# Patient Record
Sex: Female | Born: 1970 | Race: White | Hispanic: No | Marital: Married | Smoking: Never smoker
Health system: Southern US, Community
[De-identification: ages and names within clinical notes are randomized; demographics above are authoritative.]

## PROBLEM LIST (undated history)

## (undated) DIAGNOSIS — J45909 Unspecified asthma, uncomplicated: Secondary | ICD-10-CM

## (undated) DIAGNOSIS — T7840XA Allergy, unspecified, initial encounter: Secondary | ICD-10-CM

## (undated) HISTORY — DX: Unspecified asthma, uncomplicated: J45.909

## (undated) HISTORY — DX: Allergy, unspecified, initial encounter: T78.40XA

## (undated) HISTORY — PX: TUBAL LIGATION: SHX77

---

## 1999-12-27 ENCOUNTER — Ambulatory Visit (HOSPITAL_COMMUNITY): Admission: RE | Admit: 1999-12-27 | Discharge: 1999-12-27 | Payer: Self-pay | Admitting: Obstetrics and Gynecology

## 2000-02-03 ENCOUNTER — Inpatient Hospital Stay (HOSPITAL_COMMUNITY): Admission: AD | Admit: 2000-02-03 | Discharge: 2000-02-03 | Payer: Self-pay | Admitting: Obstetrics & Gynecology

## 2000-02-08 ENCOUNTER — Inpatient Hospital Stay (HOSPITAL_COMMUNITY): Admission: AD | Admit: 2000-02-08 | Discharge: 2000-02-08 | Payer: Self-pay | Admitting: Obstetrics and Gynecology

## 2000-03-12 ENCOUNTER — Inpatient Hospital Stay (HOSPITAL_COMMUNITY): Admission: AD | Admit: 2000-03-12 | Discharge: 2000-03-14 | Payer: Self-pay | Admitting: Obstetrics and Gynecology

## 2000-04-21 ENCOUNTER — Other Ambulatory Visit: Admission: RE | Admit: 2000-04-21 | Discharge: 2000-04-21 | Payer: Self-pay | Admitting: Obstetrics and Gynecology

## 2002-06-24 ENCOUNTER — Ambulatory Visit (HOSPITAL_BASED_OUTPATIENT_CLINIC_OR_DEPARTMENT_OTHER): Admission: RE | Admit: 2002-06-24 | Discharge: 2002-06-24 | Payer: Self-pay | Admitting: Obstetrics and Gynecology

## 2013-08-24 ENCOUNTER — Encounter: Payer: Self-pay | Admitting: General Practice

## 2013-08-24 ENCOUNTER — Ambulatory Visit (INDEPENDENT_AMBULATORY_CARE_PROVIDER_SITE_OTHER): Payer: Managed Care, Other (non HMO) | Admitting: General Practice

## 2013-08-24 VITALS — BP 100/72 | Temp 98.1°F | Ht 63.75 in | Wt 173.0 lb

## 2013-08-24 DIAGNOSIS — Z8349 Family history of other endocrine, nutritional and metabolic diseases: Secondary | ICD-10-CM

## 2013-08-24 DIAGNOSIS — Z0000 Encounter for general adult medical examination without abnormal findings: Secondary | ICD-10-CM

## 2013-08-24 DIAGNOSIS — Z83438 Family history of other disorder of lipoprotein metabolism and other lipidemia: Secondary | ICD-10-CM

## 2013-08-24 LAB — POCT CBC
Granulocyte percent: 69.3 %G (ref 37–80)
HCT, POC: 40 % (ref 37.7–47.9)
Hemoglobin: 13.5 g/dL (ref 12.2–16.2)
Lymph, poc: 2 (ref 0.6–3.4)
MCHC: 33.8 g/dL (ref 31.8–35.4)
MCV: 89.9 fL (ref 80–97)
POC Granulocyte: 4.9 (ref 2–6.9)
RDW, POC: 13.8 %
WBC: 7.1 10*3/uL (ref 4.6–10.2)

## 2013-08-24 NOTE — Progress Notes (Signed)
  Subjective:    Patient ID: Teresa Woodard, female    DOB: 1971/01/28, 42 y.o.   MRN: [id number]  HPI Patient presents today for annual physical. She reports having asthma and taking symbicort as directed. She denies having concerns at this time. Reports last menstrual cycle as last week and it was regular cycle.     Review of Systems  Constitutional: Negative for fever and chills.  HENT: Negative for ear pain, neck pain and neck stiffness.   Respiratory: Negative for chest tightness and shortness of breath.   Gastrointestinal: Negative for nausea, vomiting, abdominal pain and blood in stool.  Genitourinary: Negative for hematuria.  Musculoskeletal: Negative for gait problem.  Neurological: Negative for dizziness, weakness and headaches.       Objective:   Physical Exam  Constitutional: She is oriented to person, place, and time. She appears well-developed and well-nourished.  HENT:  Head: Normocephalic and atraumatic.  Right Ear: External ear normal.  Left Ear: External ear normal.  Nose: Nose normal.  Mouth/Throat: Oropharynx is clear and moist.  Eyes: EOM are normal. Pupils are equal, round, and reactive to light.  Neck: Normal range of motion. Neck supple. No thyromegaly present.  Cardiovascular: Normal rate, regular rhythm and normal heart sounds.   Pulmonary/Chest: Effort normal and breath sounds normal. No respiratory distress. She exhibits no tenderness.  Abdominal: Soft. Bowel sounds are normal. She exhibits no distension. There is no tenderness.  Musculoskeletal: She exhibits no edema and no tenderness.  Lymphadenopathy:    She has no cervical adenopathy.  Neurological: She is alert and oriented to person, place, and time.  Skin: Skin is warm and dry.  Psychiatric: She has a normal mood and affect.          Assessment & Plan:  1. Annual physical exam - POCT CBC - CMP14+EGFR  2. Family history of hyperlipidemia - Lipid panel -follow up in one year for  annual physical and sooner if needed -Patient verbalized understanding -Coralie Keens, FNP-C

## 2013-08-25 LAB — LIPID PANEL
Chol/HDL Ratio: 4.1 ratio units (ref 0.0–4.4)
LDL Calculated: 109 mg/dL — ABNORMAL HIGH (ref 0–99)
Triglycerides: 115 mg/dL (ref 0–149)

## 2013-08-25 LAB — CMP14+EGFR
AST: 27 IU/L (ref 0–40)
Albumin: 4.2 g/dL (ref 3.5–5.5)
BUN: 13 mg/dL (ref 6–24)
CO2: 25 mmol/L (ref 18–29)
Calcium: 9.6 mg/dL (ref 8.7–10.2)
Creatinine, Ser: 0.82 mg/dL (ref 0.57–1.00)
Globulin, Total: 2.6 g/dL (ref 1.5–4.5)
Potassium: 4.6 mmol/L (ref 3.5–5.2)
Sodium: 142 mmol/L (ref 134–144)

## 2013-08-31 ENCOUNTER — Telehealth: Payer: Self-pay | Admitting: General Practice

## 2013-08-31 NOTE — Telephone Encounter (Signed)
Aware of results. 

## 2015-06-12 ENCOUNTER — Encounter: Payer: Self-pay | Admitting: Family Medicine

## 2016-06-24 ENCOUNTER — Ambulatory Visit (INDEPENDENT_AMBULATORY_CARE_PROVIDER_SITE_OTHER): Payer: Managed Care, Other (non HMO) | Admitting: Family Medicine

## 2016-06-24 ENCOUNTER — Encounter: Payer: Self-pay | Admitting: Family Medicine

## 2016-06-24 VITALS — BP 114/73 | HR 82 | Temp 98.1°F

## 2016-06-24 DIAGNOSIS — R591 Generalized enlarged lymph nodes: Secondary | ICD-10-CM

## 2016-06-24 DIAGNOSIS — R599 Enlarged lymph nodes, unspecified: Secondary | ICD-10-CM

## 2016-06-24 NOTE — Progress Notes (Signed)
BP 114/73 (BP Location: Left Arm, Patient Position: Sitting, Cuff Size: Normal)   Pulse 82   Temp 98.1 F (36.7 C) (Oral)   LMP 06/13/2016 (Approximate)    Subjective:    Patient ID: Teresa Woodard, female    DOB: 1971-08-23, 45 y.o.   MRN: [id number]  HPI: Teresa Woodard is a 45 y.o. female presenting on 06/24/2016 for Knot on neck   HPI Lump on neck Lump on her left side of her neck. She has noticed over the past 3 days that she has started to have this small somewhat tender lump on the left side of her neck. She has never had it before. She does not know of any recent colds or anything that she has had that she remembers but she may have had one that just passed bile stone. She denies any fevers or chills or overlying skin changes. She denies any sore throat or shortness of breath.  Relevant past medical, surgical, family and social history reviewed and updated as indicated. Interim medical history since our last visit reviewed. Allergies and medications reviewed and updated.  Review of Systems  Constitutional: Negative for chills and fever.  HENT: Negative for congestion, ear discharge, ear pain, postnasal drip, sinus pressure and sore throat.   Eyes: Negative for redness and visual disturbance.  Respiratory: Negative for chest tightness and shortness of breath.   Cardiovascular: Negative for chest pain and leg swelling.  Genitourinary: Negative for difficulty urinating and dysuria.  Musculoskeletal: Negative for back pain and gait problem.  Skin: Negative for color change and rash.  Neurological: Negative for light-headedness and headaches.  Psychiatric/Behavioral: Negative for agitation and behavioral problems.  All other systems reviewed and are negative.   Per HPI unless specifically indicated above     Medication List       Accurate as of 06/24/16 12:59 PM. Always use your most recent med list.          cetirizine 10 MG tablet Commonly known as:   ZYRTEC Take 10 mg by mouth daily.   cholecalciferol 1000 units tablet Commonly known as:  VITAMIN D Take 1,000 Units by mouth daily.   fluticasone 50 MCG/ACT nasal spray Commonly known as:  FLONASE Place 2 sprays into both nostrils daily.   SYMBICORT 160-4.5 MCG/ACT inhaler Generic drug:  budesonide-formoterol          Objective:    BP 114/73 (BP Location: Left Arm, Patient Position: Sitting, Cuff Size: Normal)   Pulse 82   Temp 98.1 F (36.7 C) (Oral)   LMP 06/13/2016 (Approximate)   Wt Readings from Last 3 Encounters:  08/24/13 173 lb (78.5 kg)    Physical Exam  Constitutional: She is oriented to person, place, and time. She appears well-developed and well-nourished. No distress.  Eyes: Conjunctivae and EOM are normal. Pupils are equal, round, and reactive to light.  Cardiovascular: Normal rate, regular rhythm, normal heart sounds and intact distal pulses.   No murmur heard. Pulmonary/Chest: Effort normal and breath sounds normal. No respiratory distress. She has no wheezes.  Musculoskeletal: Normal range of motion. She exhibits no edema or tenderness.  Lymphadenopathy:    She has cervical adenopathy (Left anterior cervical lymph node that is tender and mobile).  Neurological: She is alert and oriented to person, place, and time. Coordination normal.  Skin: Skin is warm and dry. No rash noted. She is not diaphoretic.  Psychiatric: She has a normal mood and affect. Her behavior is normal.  Nursing  note and vitals reviewed.     Assessment & Plan:   Problem List Items Addressed This Visit    None    Visit Diagnoses    Lymphadenopathy of head and neck    -  Primary   Enlarged lymph node on left anterior cervical. We'll monitor for now, it's only been there 3 days       Follow up plan: Return in about 4 weeks (around 07/22/2016), or if symptoms worsen or fail to improve, for Well woman exam and fasting labs.  Counseling provided for all of the vaccine  components No orders of the defined types were placed in this encounter.   Arville Care, MD [facility] 06/24/2016, 12:59 PM

## 2016-07-25 ENCOUNTER — Ambulatory Visit (INDEPENDENT_AMBULATORY_CARE_PROVIDER_SITE_OTHER): Payer: Managed Care, Other (non HMO) | Admitting: Family Medicine

## 2016-07-25 ENCOUNTER — Encounter: Payer: Self-pay | Admitting: Family Medicine

## 2016-07-25 VITALS — BP 113/73 | HR 77 | Temp 98.0°F | Ht 63.75 in | Wt 177.2 lb

## 2016-07-25 DIAGNOSIS — Z0000 Encounter for general adult medical examination without abnormal findings: Secondary | ICD-10-CM | POA: Diagnosis not present

## 2016-07-25 DIAGNOSIS — Z114 Encounter for screening for human immunodeficiency virus [HIV]: Secondary | ICD-10-CM

## 2016-07-25 DIAGNOSIS — B351 Tinea unguium: Secondary | ICD-10-CM

## 2016-07-25 DIAGNOSIS — R590 Localized enlarged lymph nodes: Secondary | ICD-10-CM

## 2016-07-25 MED ORDER — TERBINAFINE HCL 250 MG PO TABS: 250.0000 mg | ORAL_TABLET | Freq: Every day | ORAL | 2 refills | Status: DC

## 2016-07-25 NOTE — Progress Notes (Signed)
BP 113/73   Pulse 77   Temp 98 F (36.7 C) (Oral)   Ht 5' 3.75" (1.619 m)   Wt 177 lb 3.2 oz (80.4 kg)   LMP 07/11/2016 (Approximate)   BMI 30.66 kg/m    Subjective:    Patient ID: Teresa Woodard, female    DOB: 08-Nov-1971, 45 y.o.   MRN: [id number]  HPI: Teresa Woodard is a 45 y.o. female presenting on 07/25/2016 for Annual Exam (needs paperwork filled out for work; needs lipid panel, blood sugar and BMI - patient is fasting)   HPI Well adult exam Patient is coming today for a well adult exam without Pap because she gets her Pap smear down in [place]. She denies any chest pain, shortness of breath, headaches or vision issues, abdominal complaints, diarrhea, nausea, vomiting, or joint issues.   Toenail fungus Patient has had toenail fungus on her 2 big toes for quite some time and is starting to get it on her fourth and fifth toenails on her right foot as well. She tried an oral medication for a few weeks quite some time ago but did not notice that it helped. She has been dealing with this for many years and wants no further something that can be done to treat it. She denies any pain associated with it or redness or warmth.   Lump on neck Patient still feels like she has a lump on the left side of her neck which we thought initially was an anterior lymph node only saw her a few months ago but she still feels like it's there and has not changed. It is nontender and mobile and small and no overlying skin changes. Does not cause any issues with her swallowing.  Relevant past medical, surgical, family and social history reviewed and updated as indicated. Interim medical history since our last visit reviewed. Allergies and medications reviewed and updated.  Review of Systems  Constitutional: Negative for chills and fever.  HENT: Negative for congestion, ear discharge, ear pain and tinnitus.   Eyes: Negative for pain, redness and visual disturbance.  Respiratory: Negative for  cough, chest tightness, shortness of breath and wheezing.   Cardiovascular: Negative for chest pain, palpitations and leg swelling.  Gastrointestinal: Negative for abdominal pain, blood in stool, constipation and diarrhea.  Genitourinary: Negative for difficulty urinating, dysuria and hematuria.  Musculoskeletal: Negative for back pain, gait problem and myalgias.  Skin: Positive for color change (toenail fungus). Negative for rash.  Neurological: Negative for dizziness, weakness, light-headedness and headaches.  Psychiatric/Behavioral: Negative for agitation, behavioral problems and suicidal ideas.  All other systems reviewed and are negative.   Per HPI unless specifically indicated above     Medication List       Accurate as of 07/25/16 11:11 AM. Always use your most recent med list.          cetirizine 10 MG tablet Commonly known as:  ZYRTEC Take 10 mg by mouth daily.   cholecalciferol 1000 units tablet Commonly known as:  VITAMIN D Take 1,000 Units by mouth daily.   fluticasone 50 MCG/ACT nasal spray Commonly known as:  FLONASE Place 2 sprays into both nostrils daily.   SYMBICORT 160-4.5 MCG/ACT inhaler Generic drug:  budesonide-formoterol   terbinafine 250 MG tablet Commonly known as:  LAMISIL Take 1 tablet (250 mg total) by mouth daily.          Objective:    BP 113/73   Pulse 77   Temp 98 F (  36.7 C) (Oral)   Ht 5' 3.75" (1.619 m)   Wt 177 lb 3.2 oz (80.4 kg)   LMP 07/11/2016 (Approximate)   BMI 30.66 kg/m   Wt Readings from Last 3 Encounters:  07/25/16 177 lb 3.2 oz (80.4 kg)  08/24/13 173 lb (78.5 kg)    Physical Exam  Constitutional: She is oriented to person, place, and time. She appears well-developed and well-nourished. No distress.  HENT:  Right Ear: External ear normal.  Left Ear: External ear normal.  Nose: Nose normal.  Mouth/Throat: Oropharynx is clear and moist. No oropharyngeal exudate.  Eyes: Conjunctivae and EOM are normal.  Pupils are equal, round, and reactive to light.  Neck: Neck supple.  Cardiovascular: Normal rate, regular rhythm, normal heart sounds and intact distal pulses.   No murmur heard. Pulmonary/Chest: Effort normal and breath sounds normal. No respiratory distress. She has no wheezes.  Abdominal: Soft. Bowel sounds are normal. She exhibits no distension. There is no tenderness. There is no rebound and no guarding.  Musculoskeletal: Normal range of motion. She exhibits no edema or tenderness.  Lymphadenopathy:    She has cervical adenopathy (Patient has a mobile nontender left anterior cervical lymph node versus thyromegaly on that side., Based on exam feels more like a lymph node.).  Neurological: She is alert and oriented to person, place, and time. Coordination normal.  Skin: Skin is warm and dry. No rash noted. She is not diaphoretic.  Patient has thickened and yellowed discolored nails on both of her great toes and her right fourth and fifth toenails.  Psychiatric: She has a normal mood and affect. Her behavior is normal. Thought content normal.  Nursing note and vitals reviewed.   Results for orders placed or performed in visit on 08/24/13  Lipid panel  Result Value Ref Range   Cholesterol, Total 174 100 - 199 mg/dL   Triglycerides 115 0 - 149 mg/dL   HDL 42 >39 mg/dL   VLDL Cholesterol Cal 23 5 - 40 mg/dL   LDL Calculated 109 (H) 0 - 99 mg/dL   Chol/HDL Ratio 4.1 0.0 - 4.4 ratio units  CMP14+EGFR  Result Value Ref Range   Glucose 81 65 - 99 mg/dL   BUN 13 6 - 24 mg/dL   Creatinine, Ser 0.82 0.57 - 1.00 mg/dL   GFR calc non Af Amer 88 >59 mL/min/1.73   GFR calc Af Amer 101 >59 mL/min/1.73   BUN/Creatinine Ratio 16 9 - 23   Sodium 142 134 - 144 mmol/L   Potassium 4.6 3.5 - 5.2 mmol/L   Chloride 101 97 - 108 mmol/L   CO2 25 18 - 29 mmol/L   Calcium 9.6 8.7 - 10.2 mg/dL   Total Protein 6.8 6.0 - 8.5 g/dL   Albumin 4.2 3.5 - 5.5 g/dL   Globulin, Total 2.6 1.5 - 4.5 g/dL    Albumin/Globulin Ratio 1.6 1.1 - 2.5   Total Bilirubin 0.5 0.0 - 1.2 mg/dL   Alkaline Phosphatase 79 39 - 117 IU/L   AST 27 0 - 40 IU/L   ALT 44 (H) 0 - 32 IU/L  POCT CBC  Result Value Ref Range   WBC 7.1 4.6 - 10.2 K/uL   Lymph, poc 2.0 0.6 - 3.4   POC LYMPH PERCENT 28.6 10 - 50 %L   POC Granulocyte 4.9 2 - 6.9   Granulocyte percent 69.3 37 - 80 %G   RBC 4.5 4.04 - 5.48 M/uL   Hemoglobin 13.5 12.2 - 16.2 g/dL  HCT, POC 40.0 37.7 - 47.9 %   MCV 89.9 80 - 97 fL   MCH, POC 30.4 27 - 31.2 pg   MCHC 33.8 31.8 - 35.4 g/dL   RDW, POC 13.8 %   Platelet Count, POC 254.0 142 - 424 K/uL   MPV 7.2 0 - 99.8 fL      Assessment & Plan:   Problem List Items Addressed This Visit    None    Visit Diagnoses    Well adult exam    -  Primary   Patient gets Pap smears elsewhere   Relevant Orders   CMP14+EGFR   Lipid panel   Anterior cervical lymphadenopathy       Concern for anterior cervical lymphadenopathy versus thyroid enlargement, will do ultrasound and test thyroid levels   Relevant Orders   TSH   US Soft Tissue Head/Neck   Onychomycosis       Left big toe and right big toe and fourth and fifth toe, will do 12 week course   Relevant Medications   terbinafine (LAMISIL) 250 MG tablet   Screening for HIV without presence of risk factors       Relevant Orders   HIV antibody       Follow up plan: Return in about 1 year (around 07/25/2017), or if symptoms worsen or fail to improve.  Counseling provided for all of the vaccine components Orders Placed This Encounter  Procedures  . US Soft Tissue Head/Neck  . CMP14+EGFR  . Lipid panel  . TSH  . HIV antibody    Caryl Pina, MD [facility] 07/25/2016, 11:11 AM

## 2016-07-26 LAB — LIPID PANEL
Chol/HDL Ratio: 3.9 ratio units (ref 0.0–4.4)
Cholesterol, Total: 169 mg/dL (ref 100–199)
HDL: 43 mg/dL (ref >39)
LDL Calculated: 100 mg/dL — ABNORMAL HIGH (ref 0–99)
Triglycerides: 129 mg/dL (ref 0–149)
VLDL Cholesterol Cal: 26 mg/dL (ref 5–40)

## 2016-07-26 LAB — CMP14+EGFR
A/G RATIO: 1.6 (ref 1.2–2.2)
ALK PHOS: 82 IU/L (ref 39–117)
ALT: 38 IU/L — AB (ref 0–32)
AST: 25 IU/L (ref 0–40)
Albumin: 4.2 g/dL (ref 3.5–5.5)
BILIRUBIN TOTAL: 0.6 mg/dL (ref 0.0–1.2)
BUN/Creatinine Ratio: 14 (ref 9–23)
BUN: 12 mg/dL (ref 6–24)
CO2: 20 mmol/L (ref 18–29)
CREATININE: 0.87 mg/dL (ref 0.57–1.00)
Calcium: 9.2 mg/dL (ref 8.7–10.2)
Chloride: 102 mmol/L (ref 96–106)
GFR calc Af Amer: 92 mL/min/{1.73_m2} (ref >59)
GFR, EST NON AFRICAN AMERICAN: 80 mL/min/{1.73_m2} (ref >59)
Globulin, Total: 2.6 g/dL (ref 1.5–4.5)
Glucose: 86 mg/dL (ref 65–99)
Potassium: 4 mmol/L (ref 3.5–5.2)
Sodium: 141 mmol/L (ref 134–144)
Total Protein: 6.8 g/dL (ref 6.0–8.5)

## 2016-07-26 LAB — TSH: TSH: 1.16 u[IU]/mL (ref 0.450–4.500)

## 2016-07-26 LAB — HIV ANTIBODY (ROUTINE TESTING W REFLEX): HIV Screen 4th Generation wRfx: NONREACTIVE

## 2016-07-29 ENCOUNTER — Ambulatory Visit (HOSPITAL_COMMUNITY)
Admission: RE | Admit: 2016-07-29 | Discharge: 2016-07-29 | Disposition: A | Discharge status: Home / Self Care | Payer: Managed Care, Other (non HMO) | Source: Ambulatory Visit | Attending: Family Medicine | Admitting: Family Medicine

## 2016-07-29 DIAGNOSIS — R599 Enlarged lymph nodes, unspecified: Secondary | ICD-10-CM | POA: Insufficient documentation

## 2016-07-29 DIAGNOSIS — R590 Localized enlarged lymph nodes: Secondary | ICD-10-CM

## 2016-09-25 ENCOUNTER — Other Ambulatory Visit: Payer: Self-pay | Admitting: Obstetrics and Gynecology

## 2016-09-25 DIAGNOSIS — Z803 Family history of malignant neoplasm of breast: Secondary | ICD-10-CM

## 2016-10-20 ENCOUNTER — Other Ambulatory Visit: Payer: Self-pay | Admitting: Family Medicine

## 2016-10-20 DIAGNOSIS — B351 Tinea unguium: Secondary | ICD-10-CM

## 2016-10-23 ENCOUNTER — Ambulatory Visit
Admission: RE | Admit: 2016-10-23 | Discharge: 2016-10-23 | Disposition: A | Discharge status: Home / Self Care | Payer: Managed Care, Other (non HMO) | Source: Ambulatory Visit | Attending: Obstetrics and Gynecology | Admitting: Obstetrics and Gynecology

## 2016-10-23 DIAGNOSIS — Z803 Family history of malignant neoplasm of breast: Secondary | ICD-10-CM

## 2016-10-23 MED ORDER — GADOBENATE DIMEGLUMINE 529 MG/ML IV SOLN
15.0000 mL | Freq: Once | INTRAVENOUS | Status: AC | PRN
  Administered 2016-10-23: 15 mL via INTRAVENOUS

## 2017-01-17 ENCOUNTER — Other Ambulatory Visit: Payer: Self-pay | Admitting: Family Medicine

## 2017-01-17 DIAGNOSIS — B351 Tinea unguium: Secondary | ICD-10-CM

## 2017-02-17 ENCOUNTER — Other Ambulatory Visit: Payer: Self-pay | Admitting: Family Medicine

## 2017-02-17 DIAGNOSIS — B351 Tinea unguium: Secondary | ICD-10-CM

## 2017-10-31 ENCOUNTER — Other Ambulatory Visit: Payer: Self-pay | Admitting: Obstetrics and Gynecology

## 2017-10-31 DIAGNOSIS — Z803 Family history of malignant neoplasm of breast: Secondary | ICD-10-CM

## 2017-11-21 ENCOUNTER — Ambulatory Visit
Admission: RE | Admit: 2017-11-21 | Discharge: 2017-11-21 | Disposition: A | Discharge status: Home / Self Care | Payer: 59 | Source: Ambulatory Visit | Attending: Obstetrics and Gynecology | Admitting: Obstetrics and Gynecology

## 2017-11-21 DIAGNOSIS — Z803 Family history of malignant neoplasm of breast: Secondary | ICD-10-CM

## 2017-11-21 MED ORDER — GADOBENATE DIMEGLUMINE 529 MG/ML IV SOLN
17.0000 mL | Freq: Once | INTRAVENOUS | Status: AC | PRN
  Administered 2017-11-21: 17 mL via INTRAVENOUS

## 2018-06-26 ENCOUNTER — Encounter: Payer: Self-pay | Admitting: Pediatrics

## 2018-06-26 ENCOUNTER — Ambulatory Visit (INDEPENDENT_AMBULATORY_CARE_PROVIDER_SITE_OTHER): Payer: Managed Care, Other (non HMO) | Admitting: Pediatrics

## 2018-06-26 VITALS — BP 119/78 | HR 86 | Temp 97.6°F | Ht 63.75 in | Wt 176.8 lb

## 2018-06-26 DIAGNOSIS — L237 Allergic contact dermatitis due to plants, except food: Secondary | ICD-10-CM | POA: Diagnosis not present

## 2018-06-26 MED ORDER — TRIAMCINOLONE ACETONIDE 0.025 % EX OINT: 1.0000 "application " | TOPICAL_OINTMENT | Freq: Two times a day (BID) | CUTANEOUS | 0 refills | Status: DC

## 2018-06-26 NOTE — Progress Notes (Addendum)
  Subjective:   Patient ID: Teresa Woodard, female    DOB: 10/13/1971, 47 y.o.   MRN: [id number] CC: Rash (left side of face, scalp and chest, )  HPI: Teresa Geroldricia M Thaxton is a 47 y.o. female   Place left side of her nose bothering her the most.  Red papule there.  Itchy.   Son recently got poison ivy, she thinks she might also have some.  She has been using topical Benadryl and oral Benadryl, helps some with the itching, still bothering her.  Relevant past medical, surgical, family and social history reviewed. Allergies and medications reviewed and updated. Social History   Tobacco Use  Smoking Status Never Smoker  Smokeless Tobacco Never Used   ROS: Per HPI   Objective:    BP 119/78   Pulse 86   Temp 97.6 F (36.4 C) (Oral)   Ht 5' 3.75" (1.619 m)   Wt 176 lb 12.8 oz (80.2 kg)   BMI 30.59 kg/m   Wt Readings from Last 3 Encounters:  06/26/18 176 lb 12.8 oz (80.2 kg)  07/25/16 177 lb 3.2 oz (80.4 kg)  08/24/13 173 lb (78.5 kg)    Gen: NAD, alert, cooperative with exam, NCAT EYES: EOMI, no conjunctival injection, or no icterus CV: NRRR, normal S1/S2, no murmur, distal pulses 2+ b/l Resp: CTABL, no wheezes, normal WOB Neuro: Alert and oriented, strength equal b/l UE and LE, coordination grossly normal MSK: normal muscle bulk Skin: Slightly red <1cm patch with a 1mm red papule on her cheek to the left of her nose.    Assessment & Plan:  Nicki Guadalajararicia was seen today for rash.  Diagnoses and all orders for this visit:  Poison ivy Use below next couple days.  Use sunscreen, sun protection on parts of skin where it is used.  If rash/itching do not improve, and for any worsening let me know. -     triamcinolone (KENALOG) 0.025 % ointment; Apply 1 application topically 2 (two) times daily.   Follow up plan: As needed Rex Krasarol Vincent, MD [facility]

## 2018-10-15 ENCOUNTER — Encounter: Payer: Self-pay | Admitting: Pediatrics

## 2018-10-15 ENCOUNTER — Ambulatory Visit: Payer: 59 | Admitting: Pediatrics

## 2018-10-15 VITALS — BP 120/79 | HR 104 | Temp 97.5°F | Resp 22 | Ht 63.75 in | Wt 177.6 lb

## 2018-10-15 DIAGNOSIS — J069 Acute upper respiratory infection, unspecified: Secondary | ICD-10-CM

## 2018-10-15 NOTE — Patient Instructions (Signed)

## 2018-10-15 NOTE — Progress Notes (Signed)
  Subjective:   Patient ID: Teresa Woodard, female    DOB: 11/29/1971, 47 y.o.   MRN: [id number] CC: Cough; Chest congestion; and Chills  HPI: Teresa Woodard is a 47 y.o. female   Symptoms started 3 days ago. Chills at home. Coughing some, mostly dry.  Some nasal congestion.  Ears feel itchy.  Mother recently put on hospice, family members taking turns watching her.  Appetite has been slightly down for the last day.  Taking Flonase, Zyrtec, NyQuil.  Relevant past medical, surgical, family and social history reviewed. Allergies and medications reviewed and updated. Social History   Tobacco Use  Smoking Status Never Smoker  Smokeless Tobacco Never Used   ROS: Per HPI   Objective:    BP 120/79   Pulse (!) 104   Temp (!) 97.5 F (36.4 C) (Oral)   Resp (!) 22   Ht 5' 3.75" (1.619 m)   Wt 177 lb 9.6 oz (80.6 kg)   SpO2 96%   BMI 30.72 kg/m   Wt Readings from Last 3 Encounters:  10/15/18 177 lb 9.6 oz (80.6 kg)  06/26/18 176 lb 12.8 oz (80.2 kg)  07/25/16 177 lb 3.2 oz (80.4 kg)    Gen: NAD, alert, cooperative with exam, NCAT, congested EYES: EOMI, no conjunctival injection, or no icterus ENT:  TMs pearly pink with small layering white effusion b/l, OP without erythema LYMPH: no cervical LAD CV: NRRR, normal S1/S2, no murmur, distal pulses 2+ b/l Resp: CTABL, no wheezes, normal WOB Abd: +BS, soft, NTND. no guarding or organomegaly Ext: No edema, warm Neuro: Alert and oriented, strength equal b/l UE and LE, coordination grossly normal MSK: normal muscle bulk  Assessment & Plan:  Teresa Woodard was seen today for cough, chest congestion and chills.  Diagnoses and all orders for this visit:  Acute URI Symptoms ongoing for 3 days.  Discussed symptom care, sinus rinses, return precautions.  Follow up plan: Return if symptoms worsen or fail to improve. Rex Krasarol Lucius Wise, MD [facility]

## 2018-11-20 ENCOUNTER — Other Ambulatory Visit: Payer: Self-pay | Admitting: Obstetrics and Gynecology

## 2018-11-20 DIAGNOSIS — Z803 Family history of malignant neoplasm of breast: Secondary | ICD-10-CM

## 2019-08-26 ENCOUNTER — Other Ambulatory Visit: Payer: Self-pay

## 2019-08-27 ENCOUNTER — Ambulatory Visit (INDEPENDENT_AMBULATORY_CARE_PROVIDER_SITE_OTHER): Payer: 59 | Admitting: Family Medicine

## 2019-08-27 ENCOUNTER — Encounter: Payer: Self-pay | Admitting: Family Medicine

## 2019-08-27 VITALS — BP 118/77 | HR 83 | Temp 97.5°F | Resp 16 | Ht 63.75 in | Wt 183.2 lb

## 2019-08-27 DIAGNOSIS — Z0001 Encounter for general adult medical examination with abnormal findings: Secondary | ICD-10-CM | POA: Diagnosis not present

## 2019-08-27 DIAGNOSIS — K219 Gastro-esophageal reflux disease without esophagitis: Secondary | ICD-10-CM

## 2019-08-27 DIAGNOSIS — Z803 Family history of malignant neoplasm of breast: Secondary | ICD-10-CM | POA: Diagnosis not present

## 2019-08-27 DIAGNOSIS — Z6831 Body mass index (BMI) 31.0-31.9, adult: Secondary | ICD-10-CM

## 2019-08-27 DIAGNOSIS — F411 Generalized anxiety disorder: Secondary | ICD-10-CM

## 2019-08-27 DIAGNOSIS — Z13220 Encounter for screening for lipoid disorders: Secondary | ICD-10-CM

## 2019-08-27 DIAGNOSIS — J4530 Mild persistent asthma, uncomplicated: Secondary | ICD-10-CM

## 2019-08-27 DIAGNOSIS — Z23 Encounter for immunization: Secondary | ICD-10-CM

## 2019-08-27 DIAGNOSIS — E559 Vitamin D deficiency, unspecified: Secondary | ICD-10-CM | POA: Insufficient documentation

## 2019-08-27 DIAGNOSIS — L639 Alopecia areata, unspecified: Secondary | ICD-10-CM | POA: Insufficient documentation

## 2019-08-27 DIAGNOSIS — J45909 Unspecified asthma, uncomplicated: Secondary | ICD-10-CM | POA: Insufficient documentation

## 2019-08-27 DIAGNOSIS — Z130 Encounter for screening for diseases of the blood and blood-forming organs and certain disorders involving the immune mechanism: Secondary | ICD-10-CM

## 2019-08-27 DIAGNOSIS — Z0000 Encounter for general adult medical examination without abnormal findings: Secondary | ICD-10-CM

## 2019-08-27 DIAGNOSIS — Z1329 Encounter for screening for other suspected endocrine disorder: Secondary | ICD-10-CM

## 2019-08-27 DIAGNOSIS — E669 Obesity, unspecified: Secondary | ICD-10-CM | POA: Insufficient documentation

## 2019-08-27 MED ORDER — HYDROXYZINE PAMOATE 25 MG PO CAPS: 25.0000 mg | ORAL_CAPSULE | Freq: Three times a day (TID) | ORAL | 1 refills | Status: DC | PRN

## 2019-08-27 MED ORDER — FLUOXETINE HCL 20 MG PO CAPS: 20.0000 mg | ORAL_CAPSULE | Freq: Every day | ORAL | 3 refills | Status: DC

## 2019-08-27 MED ORDER — ESOMEPRAZOLE MAGNESIUM 20 MG PO CPDR: 20.0000 mg | DELAYED_RELEASE_CAPSULE | Freq: Every day | ORAL | 1 refills | Status: DC

## 2019-08-27 NOTE — Patient Instructions (Signed)
Alopecia Areata, Adult  Alopecia areata is a condition that causes you to lose hair. You may lose hair on your scalp in patches. In some cases, you may lose all the hair on your scalp (alopecia totalis) or all the hair from your face and body (alopecia universalis). Alopecia areata is an autoimmune disease. This means that your body's defense system (immune system) mistakes normal parts of the body for germs or other things that can make you sick. When you have alopecia areata, the immune system attacks the hair follicles. Alopecia areata usually develops in childhood, but it can develop at any age. For some people, their hair grows back on its own and hair loss does not happen again. For others, their hair may fall out and grow back in cycles. The hair loss may last many years. Having this condition can be emotionally difficult, but it is not dangerous. What are the causes? The cause of this condition is not known. What increases the risk? This condition is more likely to develop in people who have:  A family history of alopecia.  A family history of another autoimmune disease, including type 1 diabetes and rheumatoid arthritis.  Asthma and allergies.  Down syndrome. What are the signs or symptoms? Round spots of patchy hair loss on the scalp is the main symptom of this condition. The spots may be mildly itchy. Other symptoms include:  Short dark hairs in the bald patches that are wider at the top (exclamation point hairs).  Dents, white spots, or lines in the fingernails or toenails.  Balding and body hair loss. This is rare. How is this diagnosed? This condition is diagnosed based on your symptoms and family history. Your health care provider will also check your scalp skin, teeth, and nails. Your health care provider may refer you to a specialist in hair and skin disorders (dermatologist). You may also have tests, including:  A hair pull test.  Blood tests or other screening tests  to check for autoimmune diseases, such as thyroid disease or diabetes.  Skin biopsy to confirm the diagnosis.  A procedure to examine the skin with a lighted magnifying instrument (dermoscopy). How is this treated? There is no cure for alopecia areata. Treatment is aimed at promoting the regrowth of hair and preventing the immune system from overreacting. No single treatment is right for all people with alopecia areata. It depends on the type of hair loss you have and how severe it is. Work with your health care provider to find the best treatment for you. Treatment may include:  Having regular checkups to make sure the condition is not getting worse (watchful waiting).  Steroid creams or pills for 6-8 weeks to stop the immune reaction and help hair to regrow more quickly.  Other topical medicines to alter the immune system response and support the hair growth cycle.  Steroid injections.  Therapy and counseling with a support group or therapist if you are having trouble coping with hair loss. Follow these instructions at home:  Learn as much as you can about your condition.  Apply topical creams only as told by your health care provider.  Take over-the-counter and prescription medicines only as told by your health care provider.  Consider getting a wig or products to make hair look fuller or to cover bald spots, if you feel uncomfortable with your appearance.  Get therapy or counseling if you are having a hard time coping with hair loss. Ask your health care provider to recommend   a counselor or support group.  Keep all follow-up visits as told by your health care provider. This is important. Contact a health care provider if:  Your hair loss gets worse, even with treatment.  You have new symptoms.  You are struggling emotionally. Summary  Alopecia areata is an autoimmune condition that makes your body's defense system (immune system) attack the hair follicles. This causes you  to lose hair.  Treatments may include regular checkups to make sure that the condition is not getting worse (watchful waiting), medicines, and steroid injections. This information is not intended to replace advice given to you by your health care provider. Make sure you discuss any questions you have with your health care provider. Document Released: 06/22/2004 Document Revised: 10/31/2017 Document Reviewed: 12/06/2016 Elsevier Patient Education  2020 [employer]  

## 2019-08-27 NOTE — Progress Notes (Signed)
Subjective:  Patient ID: Teresa Woodard, female    DOB: 05-05-1971, 48 y.o.   MRN: [id number]  Patient Care Team: Baruch Gouty, FNP as PCP - General (Family Medicine)   Chief Complaint:  Annual Exam   HPI: Teresa Woodard is a 48 y.o. female presenting on 08/27/2019 for Annual Exam   Pt presents today for her annual physical exam. She is followed by GYN and has her PAPs and mammograms completed there. States she is due for her mammogram. Her mother died from beast cancer and it was recommended the pt has MRI of breast every 6 months alternating with a mammogram every 6 months. Pt states she has been following this schedule.  She is on vit D repletion therapy and tolerating this well. She does have GERD, states this is well controlled with Nexium over the counter daily. States she notices a difference when she does not take the Nexium. No hemoptysis, voice changes, dysphagia, weight loss, diaphoresis, melena, or hematochezia.  Pt reports history of mild asthma. She is compliant with her medications and foes well with them. She is followed by pulmonology.  She has noticed an increase in anxiety over the last year. States her mothers illness might have triggered this. States she has had episodes of anxiety and general worry in the past but this time it has not subsided. She has developed bald spots in her head. States she went to dermatology and was told this was alopecia due to her anxiety and stress. Pt states the anxiety is affecting her sleep. States she has a very difficult time sleeping, reports it takes a long time to fall asleep and then she wakes several times during the night.   Depression screen [facility] 2/9 08/27/2019 10/15/2018 06/26/2018 07/25/2016 06/24/2016  Decreased Interest 0 0 0 0 0  Down, Depressed, Hopeless 0 0 0 0 0  PHQ - 2 Score 0 0 0 0 0   GAD 7 : Generalized Anxiety Score 08/27/2019  Nervous, Anxious, on Edge 2  Control/stop worrying 2  Worry too much - different  things 2  Trouble relaxing 2  Restless 0  Easily annoyed or irritable 1  Afraid - awful might happen 0  Total GAD 7 Score 9    Relevant past medical, surgical, family, and social history reviewed and updated as indicated.  Allergies and medications reviewed and updated. Date reviewed: Chart in Epic.   Past Medical History:  Diagnosis Date  . Asthma     Past Surgical History:  Procedure Laterality Date  . TUBAL LIGATION      Social History   Socioeconomic History  . Marital status: Married    Spouse name: Not on file  . Number of children: Not on file  . Years of education: Not on file  . Highest education level: Not on file  Occupational History  . Not on file  Social Needs  . Financial resource strain: Not on file  . Food insecurity    Worry: Not on file    Inability: Not on file  . Transportation needs    Medical: Not on file    Non-medical: Not on file  Tobacco Use  . Smoking status: Never Smoker  . Smokeless tobacco: Never Used  Substance and Sexual Activity  . Alcohol use: No  . Drug use: No  . Sexual activity: Not on file  Lifestyle  . Physical activity    Days per week: Not on file  Minutes per session: Not on file  . Stress: Not on file  Relationships  . Social Herbalist on phone: Not on file    Gets together: Not on file    Attends religious service: Not on file    Active member of club or organization: Not on file    Attends meetings of clubs or organizations: Not on file    Relationship status: Not on file  . Intimate partner violence    Fear of current or ex partner: Not on file    Emotionally abused: Not on file    Physically abused: Not on file    Forced sexual activity: Not on file  Other Topics Concern  . Not on file  Social History Narrative  . Not on file    Outpatient Encounter Medications as of 08/27/2019  Medication Sig  . albuterol (VENTOLIN HFA) 108 (90 Base) MCG/ACT inhaler Inhale into the lungs every 6 (six)  hours as needed.  . cetirizine (ZYRTEC) 10 MG tablet Take 10 mg by mouth daily.  . cholecalciferol (VITAMIN D) 1000 units tablet Take 1,000 Units by mouth daily.  . clobetasol (TEMOVATE) 0.05 % external solution Apply 1 application topically 2 (two) times daily.  Marland Kitchen esomeprazole (NEXIUM) 20 MG capsule Take 1 capsule (20 mg total) by mouth daily at 12 noon. OTC  . fluticasone (FLONASE) 50 MCG/ACT nasal spray Place 2 sprays into both nostrils daily.  . SYMBICORT 160-4.5 MCG/ACT inhaler   . [DISCONTINUED] esomeprazole (NEXIUM) 20 MG capsule Take 20 mg by mouth daily at 12 noon. OTC  . FLUoxetine (PROZAC) 20 MG capsule Take 1 capsule (20 mg total) by mouth at bedtime.  . hydrOXYzine (VISTARIL) 25 MG capsule Take 1 capsule (25 mg total) by mouth 3 (three) times daily as needed.  . [DISCONTINUED] DEXILANT 30 MG capsule Take 1 capsule by mouth daily.  . [DISCONTINUED] triamcinolone (KENALOG) 0.025 % ointment Apply 1 application topically 2 (two) times daily.   No facility-administered encounter medications on file as of 08/27/2019.     No Known Allergies  Review of Systems  Constitutional: Negative for activity change, appetite change, chills, diaphoresis, fatigue, fever and unexpected weight change.  HENT: Negative.  Negative for sore throat, tinnitus, trouble swallowing and voice change.   Eyes: Negative.  Negative for photophobia and visual disturbance.  Respiratory: Negative for cough, chest tightness and shortness of breath.   Cardiovascular: Negative for chest pain, palpitations and leg swelling.  Gastrointestinal: Negative for abdominal distention, abdominal pain, anal bleeding, blood in stool, constipation, diarrhea, nausea, rectal pain and vomiting.  Endocrine: Negative.  Negative for cold intolerance, heat intolerance, polydipsia, polyphagia and polyuria.  Genitourinary: Negative for decreased urine volume, difficulty urinating, dyspareunia, dysuria, enuresis, flank pain, frequency,  genital sores, hematuria, menstrual problem, pelvic pain, urgency, vaginal bleeding, vaginal discharge and vaginal pain.  Musculoskeletal: Negative for arthralgias, back pain, gait problem, joint swelling, myalgias, neck pain and neck stiffness.  Skin: Negative.   Allergic/Immunologic: Negative.   Neurological: Negative for dizziness, tremors, seizures, syncope, speech difficulty, weakness, light-headedness, numbness and headaches.  Hematological: Negative.  Negative for adenopathy. Does not bruise/bleed easily.  Psychiatric/Behavioral: Positive for decreased concentration, dysphoric mood and sleep disturbance. Negative for agitation, behavioral problems, confusion, hallucinations, self-injury and suicidal ideas. The patient is nervous/anxious. The patient is not hyperactive.   All other systems reviewed and are negative.       Objective:  BP 118/77   Pulse 83   Temp (!) 97.5 F (  36.4 C) (Temporal)   Resp 16   Ht 5' 3.75" (1.619 m)   Wt 183 lb 3.2 oz (83.1 kg)   LMP 08/20/2019   SpO2 95%   BMI 31.69 kg/m    Wt Readings from Last 3 Encounters:  08/27/19 183 lb 3.2 oz (83.1 kg)  10/15/18 177 lb 9.6 oz (80.6 kg)  06/26/18 176 lb 12.8 oz (80.2 kg)    Physical Exam Vitals signs and nursing note reviewed.  Constitutional:      General: She is not in acute distress.    Appearance: Normal appearance. She is well-developed and well-groomed. She is obese. She is not ill-appearing, toxic-appearing or diaphoretic.  HENT:     Head: Normocephalic and atraumatic. Hair is abnormal.     Jaw: There is normal jaw occlusion.      Right Ear: Hearing, tympanic membrane, ear canal and external ear normal.     Left Ear: Hearing, tympanic membrane, ear canal and external ear normal.     Nose: Nose normal.     Mouth/Throat:     Lips: Pink.     Mouth: Mucous membranes are moist.     Pharynx: Oropharynx is clear. Uvula midline.  Eyes:     General: Lids are normal.     Extraocular Movements:  Extraocular movements intact.     Conjunctiva/sclera: Conjunctivae normal.     Pupils: Pupils are equal, round, and reactive to light.  Neck:     Musculoskeletal: Normal range of motion and neck supple.     Thyroid: No thyroid mass, thyromegaly or thyroid tenderness.     Vascular: No carotid bruit or JVD.     Trachea: Trachea and phonation normal.  Cardiovascular:     Rate and Rhythm: Normal rate and regular rhythm.     Chest Wall: PMI is not displaced.     Pulses: Normal pulses.     Heart sounds: Normal heart sounds. No murmur. No friction rub. No gallop.   Pulmonary:     Effort: Pulmonary effort is normal. No respiratory distress.     Breath sounds: Normal breath sounds. No wheezing.  Abdominal:     General: Bowel sounds are normal. There is no distension or abdominal bruit.     Palpations: Abdomen is soft. There is no hepatomegaly or splenomegaly.     Tenderness: There is no abdominal tenderness. There is no right CVA tenderness or left CVA tenderness.     Hernia: No hernia is present.  Musculoskeletal: Normal range of motion.     Right lower leg: No edema.     Left lower leg: No edema.  Lymphadenopathy:     Cervical: No cervical adenopathy.  Skin:    General: Skin is warm and dry.     Capillary Refill: Capillary refill takes less than 2 seconds.     Coloration: Skin is not cyanotic, jaundiced or pale.     Findings: No rash.  Neurological:     General: No focal deficit present.     Mental Status: She is alert and oriented to person, place, and time.     Cranial Nerves: Cranial nerves are intact. No cranial nerve deficit.     Sensory: Sensation is intact. No sensory deficit.     Motor: Motor function is intact. No weakness.     Coordination: Coordination is intact. Coordination normal.     Gait: Gait is intact. Gait normal.     Deep Tendon Reflexes: Reflexes are normal and symmetric. Reflexes normal.  Psychiatric:        Attention and Perception: Attention and perception  normal.        Mood and Affect: Mood and affect normal.        Speech: Speech normal.        Behavior: Behavior normal. Behavior is cooperative.        Thought Content: Thought content normal.        Cognition and Memory: Cognition and memory normal.        Judgment: Judgment normal.     Results for orders placed or performed in visit on 08/27/19  CMP14+EGFR  Result Value Ref Range   Glucose 88 65 - 99 mg/dL   BUN 14 6 - 24 mg/dL   Creatinine, Ser 0.98 0.57 - 1.00 mg/dL   GFR calc non Af Amer 68 >59 mL/min/1.73   GFR calc Af Amer 78 >59 mL/min/1.73   BUN/Creatinine Ratio 14 9 - 23   Sodium 139 134 - 144 mmol/L   Potassium 4.2 3.5 - 5.2 mmol/L   Chloride 105 96 - 106 mmol/L   CO2 22 20 - 29 mmol/L   Calcium 9.6 8.7 - 10.2 mg/dL   Total Protein 6.6 6.0 - 8.5 g/dL   Albumin 4.4 3.8 - 4.8 g/dL   Globulin, Total 2.2 1.5 - 4.5 g/dL   Albumin/Globulin Ratio 2.0 1.2 - 2.2   Bilirubin Total 0.4 0.0 - 1.2 mg/dL   Alkaline Phosphatase 82 39 - 117 IU/L   AST 23 0 - 40 IU/L   ALT 29 0 - 32 IU/L  CBC with Differential/Platelet  Result Value Ref Range   WBC 7.5 3.4 - 10.8 x10E3/uL   RBC 4.39 3.77 - 5.28 x10E6/uL   Hemoglobin 13.6 11.1 - 15.9 g/dL   Hematocrit 38.9 34.0 - 46.6 %   MCV 89 79 - 97 fL   MCH 31.0 26.6 - 33.0 pg   MCHC 35.0 31.5 - 35.7 g/dL   RDW 12.8 11.7 - 15.4 %   Platelets 275 150 - 450 x10E3/uL   Neutrophils 66 Not Estab. %   Lymphs 22 Not Estab. %   Monocytes 8 Not Estab. %   Eos 3 Not Estab. %   Basos 1 Not Estab. %   Neutrophils Absolute 5.0 1.4 - 7.0 x10E3/uL   Lymphocytes Absolute 1.7 0.7 - 3.1 x10E3/uL   Monocytes Absolute 0.6 0.1 - 0.9 x10E3/uL   EOS (ABSOLUTE) 0.2 0.0 - 0.4 x10E3/uL   Basophils Absolute 0.1 0.0 - 0.2 x10E3/uL   Immature Granulocytes 0 Not Estab. %   Immature Grans (Abs) 0.0 0.0 - 0.1 x10E3/uL  Lipid panel  Result Value Ref Range   Cholesterol, Total 164 100 - 199 mg/dL   Triglycerides 121 0 - 149 mg/dL   HDL 42 >39 mg/dL   VLDL  Cholesterol Cal 22 5 - 40 mg/dL   LDL Chol Calc (NIH) 100 (H) 0 - 99 mg/dL   Chol/HDL Ratio 3.9 0.0 - 4.4 ratio  Thyroid Panel With TSH  Result Value Ref Range   TSH 1.170 0.450 - 4.500 uIU/mL   T4, Total 7.6 4.5 - 12.0 ug/dL   T3 Uptake Ratio 22 (L) 24 - 39 %   Free Thyroxine Index 1.7 1.2 - 4.9  VITAMIN D 25 Hydroxy (Vit-D Deficiency, Fractures)  Result Value Ref Range   Vit D, 25-Hydroxy 39.7 30.0 - 100.0 ng/mL       Pertinent labs & imaging results that were available during my care of  the patient were reviewed by me and considered in my medical decision making.  Assessment & Plan:  Mackynzie was seen today for annual exam.  Diagnoses and all orders for this visit:  Annual physical exam Health maintenance, diet, and exercise encouraged.  -     CMP14+EGFR -     CBC with Differential/Platelet -     Lipid panel -     Thyroid Panel With TSH  Vitamin D deficiency Labs pending. Continue repletion therapy. If indicated, will change repletion dosage. Eat foods rich in Vit D including milk, orange juice, yogurt with vitamin D added, salmon or mackerel, canned tuna fish, cereals with vitamin D added, and cod liver oil. Get out in the sun but make sure to wear at least SPF 30 sunscreen.  -     VITAMIN D 25 Hydroxy (Vit-D Deficiency, Fractures)  Family history of breast cancer in mother Has mammogram every 6 months and MRI every 6 months.   GERD without esophagitis Diet discussed. Avoid fried, spicy, fatty, and acidic foods. Avoid caffeine, nicotine, and alcohol. Do not eat 2-3 hours before bedtime and stay upright for at least 1-2 hours after eating. Eat small frequent meals. Avoid NSAID's like motrin and aleve. Medications as prescribed. Report any new or worsening symptoms. Follow up as discussed or sooner if needed.   -     esomeprazole (NEXIUM) 20 MG capsule; Take 1 capsule (20 mg total) by mouth daily at 12 noon. OTC -     CBC with Differential/Platelet  GAD (generalized anxiety  disorder) Increased and worsening since death of mother due to cancer. Has a difficult time with daily activities and sleep. Has started loosing hair. Will initiate below. Medications as prescribed. Follow up in 2-4 weeks for reevaluation. Report any new or worsening symptoms.  -     hydrOXYzine (VISTARIL) 25 MG capsule; Take 1 capsule (25 mg total) by mouth 3 (three) times daily as needed. -     FLUoxetine (PROZAC) 20 MG capsule; Take 1 capsule (20 mg total) by mouth at bedtime.  Alopecia areata Stress management. Followed by dermatology.   Mild persistent asthma without complication Followed by pulmonology. Symptoms well controlled on current regimen. Pt concerned about cost of Symbicort. Offered alternate options. Pt wants to see pulmonology for recommendations.   BMI 31.0-31.9,adult Diet and exercise encouraged. Lifestyle changes discussed. Labs pending.   Screening for lipid disorders -     Lipid panel  Screening for endocrine disorder -     CMP14+EGFR -     Thyroid Panel With TSH  Screening for deficiency anemia -     CBC with Differential/Platelet  Need for immunization against influenza -     Flu Vaccine QUAD 36+ mos IM     Continue all other maintenance medications.  Follow up plan: Return in about 4 weeks (around 09/24/2019), or if symptoms worsen or fail to improve, for GAD.  Continue healthy lifestyle choices, including diet (rich in fruits, vegetables, and lean proteins, and low in salt and simple carbohydrates) and exercise (at least 30 minutes of moderate physical activity daily).  Educational handout given for health maintenance   The above assessment and management plan was discussed with the patient. The patient verbalized understanding of and has agreed to the management plan. Patient is aware to call the clinic if they develop any new symptoms or if symptoms persist or worsen. Patient is aware when to return to the clinic for a follow-up visit. Patient  educated on  when it is appropriate to go to the emergency department.   Monia Pouch, FNP-C [facility] [phone]

## 2019-08-28 LAB — VITAMIN D 25 HYDROXY (VIT D DEFICIENCY, FRACTURES): Vit D, 25-Hydroxy: 39.7 ng/mL (ref 30.0–100.0)

## 2019-08-28 LAB — CBC WITH DIFFERENTIAL/PLATELET
Basophils Absolute: 0.1 10*3/uL (ref 0.0–0.2)
Basos: 1 %
EOS (ABSOLUTE): 0.2 10*3/uL (ref 0.0–0.4)
Eos: 3 %
Hematocrit: 38.9 % (ref 34.0–46.6)
Hemoglobin: 13.6 g/dL (ref 11.1–15.9)
Immature Grans (Abs): 0 10*3/uL (ref 0.0–0.1)
Immature Granulocytes: 0 %
Lymphocytes Absolute: 1.7 10*3/uL (ref 0.7–3.1)
Lymphs: 22 %
MCH: 31 pg (ref 26.6–33.0)
MCHC: 35 g/dL (ref 31.5–35.7)
MCV: 89 fL (ref 79–97)
Monocytes Absolute: 0.6 10*3/uL (ref 0.1–0.9)
Monocytes: 8 %
Neutrophils Absolute: 5 10*3/uL (ref 1.4–7.0)
Neutrophils: 66 %
Platelets: 275 10*3/uL (ref 150–450)
RBC: 4.39 x10E6/uL (ref 3.77–5.28)
RDW: 12.8 % (ref 11.7–15.4)
WBC: 7.5 10*3/uL (ref 3.4–10.8)

## 2019-08-28 LAB — THYROID PANEL WITH TSH
Free Thyroxine Index: 1.7 (ref 1.2–4.9)
T3 Uptake Ratio: 22 % — ABNORMAL LOW (ref 24–39)
T4, Total: 7.6 ug/dL (ref 4.5–12.0)
TSH: 1.17 u[IU]/mL (ref 0.450–4.500)

## 2019-08-28 LAB — CMP14+EGFR
ALT: 29 IU/L (ref 0–32)
AST: 23 IU/L (ref 0–40)
Albumin/Globulin Ratio: 2 (ref 1.2–2.2)
Albumin: 4.4 g/dL (ref 3.8–4.8)
Alkaline Phosphatase: 82 IU/L (ref 39–117)
BUN/Creatinine Ratio: 14 (ref 9–23)
BUN: 14 mg/dL (ref 6–24)
Bilirubin Total: 0.4 mg/dL (ref 0.0–1.2)
CO2: 22 mmol/L (ref 20–29)
Calcium: 9.6 mg/dL (ref 8.7–10.2)
Chloride: 105 mmol/L (ref 96–106)
Creatinine, Ser: 0.98 mg/dL (ref 0.57–1.00)
GFR calc Af Amer: 78 mL/min/{1.73_m2} (ref >59)
GFR calc non Af Amer: 68 mL/min/{1.73_m2} (ref >59)
Globulin, Total: 2.2 g/dL (ref 1.5–4.5)
Glucose: 88 mg/dL (ref 65–99)
Potassium: 4.2 mmol/L (ref 3.5–5.2)
Sodium: 139 mmol/L (ref 134–144)
Total Protein: 6.6 g/dL (ref 6.0–8.5)

## 2019-08-28 LAB — LIPID PANEL
Chol/HDL Ratio: 3.9 ratio (ref 0.0–4.4)
Cholesterol, Total: 164 mg/dL (ref 100–199)
HDL: 42 mg/dL (ref >39)
LDL Chol Calc (NIH): 100 mg/dL — ABNORMAL HIGH (ref 0–99)
Triglycerides: 121 mg/dL (ref 0–149)
VLDL Cholesterol Cal: 22 mg/dL (ref 5–40)

## 2019-09-01 ENCOUNTER — Telehealth: Payer: Self-pay | Admitting: *Deleted

## 2019-09-01 NOTE — Telephone Encounter (Signed)
Esomeprazole Magnesium 20mg -Non Formulary on her insurance  Must try and fail or have contraindication to all formulary alternatives   omeprazole (generic Prilosec), pantoprazole (generic Protonix: Non-Camber pantoprazole products) tablet, rabeprazole (generic Aciphex) tablet, Dexilant?

## 2019-09-01 NOTE — Telephone Encounter (Signed)
Can trial Protonix 40 mg daily

## 2019-09-02 MED ORDER — PANTOPRAZOLE SODIUM 40 MG PO TBEC: 40.0000 mg | DELAYED_RELEASE_TABLET | Freq: Every day | ORAL | 3 refills | Status: DC

## 2019-09-02 NOTE — Telephone Encounter (Signed)
Patient aware of med change dye to insurance coverage.

## 2019-09-29 ENCOUNTER — Encounter: Payer: Self-pay | Admitting: Family Medicine

## 2019-09-29 ENCOUNTER — Ambulatory Visit (INDEPENDENT_AMBULATORY_CARE_PROVIDER_SITE_OTHER): Payer: 59 | Admitting: Family Medicine

## 2019-09-29 DIAGNOSIS — F411 Generalized anxiety disorder: Secondary | ICD-10-CM

## 2019-09-29 NOTE — Progress Notes (Signed)
Virtual Visit via telephone Note Due to COVID-19 pandemic this visit was conducted virtually. This visit type was conducted due to national recommendations for restrictions regarding the COVID-19 Pandemic (e.g. social distancing, sheltering in place) in an effort to limit this patient's exposure and mitigate transmission in our community. All issues noted in this document were discussed and addressed.  A physical exam was not performed with this format.   I connected with Teresa Woodard on 09/29/2019 at 1200 by telephone and verified that I am speaking with the correct person using two identifiers. Teresa Woodard is currently located at home and no one is currently with them during visit. The provider, Kari BaarsMichelle Taylormarie Register, FNP is located in their office at time of visit.  I discussed the limitations, risks, security and privacy concerns of performing an evaluation and management service by telephone and the availability of in person appointments. I also discussed with the patient that there may be a patient responsible charge related to this service. The patient expressed understanding and agreed to proceed.  Subjective:  Patient ID: Teresa Woodard, female    DOB: 10/15/1971, 48 y.o.   MRN: [id number]  Chief Complaint:  Depression and Anxiety   HPI: Teresa Woodard is a [age] y.o. female presenting on 09/29/2019 for Depression and Anxiety   Pt following up today after initiation of fluoxetine and vistaril. Pt states she is doing better. States she still has some days that are worse than others. Pt states she is sleeping better. No SI or HI.   Anxiety Presents for follow-up visit. Symptoms include excessive worry, insomnia, irritability and nervous/anxious behavior. Patient reports no chest pain, compulsions, confusion, decreased concentration, depressed mood, dizziness, dry mouth, feeling of choking, hyperventilation, impotence, malaise, muscle tension, nausea, obsessions, palpitations,  panic, restlessness, shortness of breath or suicidal ideas. Symptoms occur occasionally. The severity of symptoms is mild. The quality of sleep is fair. Nighttime awakenings: occasional.   Compliance with medications is 76-100%.     Relevant past medical, surgical, family, and social history reviewed and updated as indicated.  Allergies and medications reviewed and updated.   Past Medical History:  Diagnosis Date   Asthma     Past Surgical History:  Procedure Laterality Date   TUBAL LIGATION      Social History   Socioeconomic History   Marital status: Married    Spouse name: Not on file   Number of children: Not on file   Years of education: Not on file   Highest education level: Not on file  Occupational History   Not on file  Social Needs   Financial resource strain: Not on file   Food insecurity    Worry: Not on file    Inability: Not on file   Transportation needs    Medical: Not on file    Non-medical: Not on file  Tobacco Use   Smoking status: Never Smoker   Smokeless tobacco: Never Used  Substance and Sexual Activity   Alcohol use: No   Drug use: No   Sexual activity: Not on file  Lifestyle   Physical activity    Days per week: Not on file    Minutes per session: Not on file   Stress: Not on file  Relationships   Social connections    Talks on phone: Not on file    Gets together: Not on file    Attends religious service: Not on file    Active member of club or organization:  Not on file    Attends meetings of clubs or organizations: Not on file    Relationship status: Not on file   Intimate partner violence    Fear of current or ex partner: Not on file    Emotionally abused: Not on file    Physically abused: Not on file    Forced sexual activity: Not on file  Other Topics Concern   Not on file  Social History Narrative   Not on file    Outpatient Encounter Medications as of 09/29/2019  Medication Sig   albuterol  (VENTOLIN HFA) 108 (90 Base) MCG/ACT inhaler Inhale into the lungs every 6 (six) hours as needed.   cetirizine (ZYRTEC) 10 MG tablet Take 10 mg by mouth daily.   cholecalciferol (VITAMIN D) 1000 units tablet Take 1,000 Units by mouth daily.   clobetasol (TEMOVATE) 0.05 % external solution Apply 1 application topically 2 (two) times daily.   FLUoxetine (PROZAC) 20 MG capsule Take 1 capsule (20 mg total) by mouth at bedtime.   fluticasone (FLONASE) 50 MCG/ACT nasal spray Place 2 sprays into both nostrils daily.   pantoprazole (PROTONIX) 40 MG tablet Take 1 tablet (40 mg total) by mouth daily.   SYMBICORT 160-4.5 MCG/ACT inhaler    No facility-administered encounter medications on file as of 09/29/2019.     No Known Allergies  Review of Systems  Constitutional: Positive for irritability. Negative for activity change, appetite change, chills, diaphoresis, fatigue, fever and unexpected weight change.  HENT: Negative.   Eyes: Negative.  Negative for photophobia and visual disturbance.  Respiratory: Negative for cough, chest tightness and shortness of breath.   Cardiovascular: Negative for chest pain, palpitations and leg swelling.  Gastrointestinal: Negative for abdominal pain, blood in stool, constipation, diarrhea, nausea and vomiting.  Endocrine: Negative.   Genitourinary: Negative for dysuria, frequency, impotence and urgency.  Musculoskeletal: Negative for arthralgias and myalgias.  Skin: Negative.   Allergic/Immunologic: Negative.   Neurological: Negative for dizziness, tremors, syncope, weakness, light-headedness and headaches.  Hematological: Negative.   Psychiatric/Behavioral: Positive for agitation. Negative for behavioral problems, confusion, decreased concentration, dysphoric mood, hallucinations, self-injury, sleep disturbance and suicidal ideas. The patient is nervous/anxious and has insomnia. The patient is not hyperactive.   All other systems reviewed and are  negative.        Observations/Objective: No vital signs or physical exam, this was a telephone or virtual health encounter.  Pt alert and oriented, answers all questions appropriately, and able to speak in full sentences.    Assessment and Plan: Mega was seen today for depression and anxiety.  Diagnoses and all orders for this visit:  GAD (generalized anxiety disorder) Pt doing well on current medications. Has seen mild improvement in symptoms. Wishes to continue with current therapy. Will reevaluate in 6-8 weeks or sooner if needed.   Follow Up Instructions: Return in about 6 weeks (around 11/10/2019), or if symptoms worsen or fail to improve.    I discussed the assessment and treatment plan with the patient. The patient was provided an opportunity to ask questions and all were answered. The patient agreed with the plan and demonstrated an understanding of the instructions.   The patient was advised to call back or seek an in-person evaluation if the symptoms worsen or if the condition fails to improve as anticipated.  The above assessment and management plan was discussed with the patient. The patient verbalized understanding of and has agreed to the management plan. Patient is aware to call the clinic  if they develop any new symptoms or if symptoms persist or worsen. Patient is aware when to return to the clinic for a follow-up visit. Patient educated on when it is appropriate to go to the emergency department.    I provided 15 minutes of non-face-to-face time during this encounter. The call started at 1200. The call ended at 1215. The other time was used for coordination of care.    Kari Baars, FNP-C [facility] [address] [place], [place] [address] [phone] 09/29/2019

## 2019-11-30 ENCOUNTER — Other Ambulatory Visit: Payer: Self-pay | Admitting: Family Medicine

## 2019-11-30 DIAGNOSIS — F411 Generalized anxiety disorder: Secondary | ICD-10-CM

## 2019-12-31 ENCOUNTER — Other Ambulatory Visit: Payer: Self-pay | Admitting: Family Medicine

## 2019-12-31 DIAGNOSIS — F411 Generalized anxiety disorder: Secondary | ICD-10-CM

## 2020-02-03 ENCOUNTER — Other Ambulatory Visit: Payer: Self-pay | Admitting: Family Medicine

## 2020-03-21 ENCOUNTER — Other Ambulatory Visit: Payer: Self-pay | Admitting: Family Medicine

## 2020-03-21 ENCOUNTER — Telehealth: Payer: Self-pay | Admitting: Family Medicine

## 2020-03-21 NOTE — Telephone Encounter (Signed)
Please review and add other lab work needed per provider. Thanks

## 2020-03-21 NOTE — Telephone Encounter (Signed)
[email or web address]

## 2020-03-21 NOTE — Telephone Encounter (Signed)
It does not look like she needs lab work. Her labs were completely normal 6-7 months ago. If she still wants to come, I can just order routine labs. Let me know.

## 2020-03-24 ENCOUNTER — Telehealth (INDEPENDENT_AMBULATORY_CARE_PROVIDER_SITE_OTHER): Payer: 59 | Admitting: Family Medicine

## 2020-03-24 ENCOUNTER — Encounter: Payer: Self-pay | Admitting: Gastroenterology

## 2020-03-24 ENCOUNTER — Encounter: Payer: Self-pay | Admitting: Family Medicine

## 2020-03-24 DIAGNOSIS — J4530 Mild persistent asthma, uncomplicated: Secondary | ICD-10-CM | POA: Diagnosis not present

## 2020-03-24 DIAGNOSIS — E559 Vitamin D deficiency, unspecified: Secondary | ICD-10-CM | POA: Diagnosis not present

## 2020-03-24 DIAGNOSIS — F411 Generalized anxiety disorder: Secondary | ICD-10-CM | POA: Diagnosis not present

## 2020-03-24 DIAGNOSIS — K219 Gastro-esophageal reflux disease without esophagitis: Secondary | ICD-10-CM | POA: Diagnosis not present

## 2020-03-24 DIAGNOSIS — Z1211 Encounter for screening for malignant neoplasm of colon: Secondary | ICD-10-CM

## 2020-03-24 DIAGNOSIS — Z0000 Encounter for general adult medical examination without abnormal findings: Secondary | ICD-10-CM

## 2020-03-24 MED ORDER — HYDROXYZINE PAMOATE 25 MG PO CAPS: 25.0000 mg | ORAL_CAPSULE | Freq: Three times a day (TID) | ORAL | 1 refills | Status: DC | PRN

## 2020-03-24 MED ORDER — FLUOXETINE HCL 20 MG PO CAPS: 20.0000 mg | ORAL_CAPSULE | Freq: Every day | ORAL | 1 refills | Status: DC

## 2020-03-24 MED ORDER — PANTOPRAZOLE SODIUM 40 MG PO TBEC: 40.0000 mg | DELAYED_RELEASE_TABLET | Freq: Every day | ORAL | 1 refills | Status: DC

## 2020-03-24 NOTE — Progress Notes (Signed)
Virtual Visit via Video note  I connected with Teresa Woodard on 03/24/20 at 8:08 AM by video and verified that I am speaking with the correct person using two identifiers. Teresa Woodard is currently located at home and nobody is currently with her during visit. The provider, Gwenlyn Fudge, FNP is located in their home at time of visit.  I discussed the limitations, risks, security and privacy concerns of performing an evaluation and management service by video and the availability of in person appointments. I also discussed with the patient that there may be a patient responsible charge related to this service. The patient expressed understanding and agreed to proceed.  Subjective: PCP: Gwenlyn Fudge, FNP  Chief Complaint  Patient presents with  . Hyperlipidemia   Patient is just having a routine checkup as she is in need of refills.  Also reports that she thought she might need some lab work to follow-up on her cholesterol levels as she was told previously that they were elevated.  Patient is not using Symbicort at this time and is doing well with her asthma.  Patient feels her Prozac is working well for her.  GAD 7 : Generalized Anxiety Score 03/24/2020 09/29/2019 08/27/2019  Nervous, Anxious, on Edge 1 1 2   Control/stop worrying 0 1 2  Worry too much - different things 0 1 2  Trouble relaxing 1 1 2   Restless 0 0 0  Easily annoyed or irritable 0 1 1  Afraid - awful might happen 0 0 0  Total GAD 7 Score 2 5 9   Anxiety Difficulty Not difficult at all - -    Pantoprazole works well for acid reflux.   ROS: Per HPI  Current Outpatient Medications:  .  albuterol (VENTOLIN HFA) 108 (90 Base) MCG/ACT inhaler, Inhale into the lungs every 6 (six) hours as needed., Disp: , Rfl:  .  cetirizine (ZYRTEC) 10 MG tablet, Take 10 mg by mouth daily., Disp: , Rfl:  .  cholecalciferol (VITAMIN D) 1000 units tablet, Take 1,000 Units by mouth daily., Disp: , Rfl:  .  FLUoxetine  (PROZAC) 20 MG capsule, Take 1 capsule (20 mg total) by mouth at bedtime., Disp: 90 capsule, Rfl: 1 .  fluticasone (FLONASE) 50 MCG/ACT nasal spray, Place 2 sprays into both nostrils daily., Disp: , Rfl:  .  hydrOXYzine (VISTARIL) 25 MG capsule, Take 1 capsule (25 mg total) by mouth 3 (three) times daily as needed for anxiety., Disp: 90 capsule, Rfl: 1 .  pantoprazole (PROTONIX) 40 MG tablet, Take 1 tablet (40 mg total) by mouth daily. Needs to be seen for future refills., Disp: 90 tablet, Rfl: 1 .  SYMBICORT 160-4.5 MCG/ACT inhaler, , Disp: , Rfl:   No Known Allergies Past Medical History:  Diagnosis Date  . Asthma     Observations/Objective: Physical Exam Constitutional:      General: She is not in acute distress.    Appearance: Normal appearance. She is not ill-appearing or toxic-appearing.  Eyes:     General: No scleral icterus.       Right eye: No discharge.        Left eye: No discharge.     Conjunctiva/sclera: Conjunctivae normal.  Pulmonary:     Effort: Pulmonary effort is normal. No respiratory distress.  Neurological:     Mental Status: She is alert and oriented to person, place, and time.  Psychiatric:        Mood and Affect: Mood normal.  Behavior: Behavior normal.        Thought Content: Thought content normal.        Judgment: Judgment normal.    Assessment and Plan: 1. GAD (generalized anxiety disorder) - Well controlled on current regimen.  - FLUoxetine (PROZAC) 20 MG capsule; Take 1 capsule (20 mg total) by mouth at bedtime.  Dispense: 90 capsule; Refill: 1 - hydrOXYzine (VISTARIL) 25 MG capsule; Take 1 capsule (25 mg total) by mouth 3 (three) times daily as needed for anxiety.  Dispense: 90 capsule; Refill: 1  2. GERD without esophagitis - Well controlled on current regimen.  - pantoprazole (PROTONIX) 40 MG tablet; Take 1 tablet (40 mg total) by mouth daily. Needs to be seen for future refills.  Dispense: 90 tablet; Refill: 1  3. Mild persistent  asthma without complication - Well controlled on current regimen.   4. Vitamin D deficiency - Well controlled on current regimen.   5. Colon cancer screening - Ambulatory referral to Gastroenterology  6.  Healthcare maintenance - Patient reports she has had her COVID-19 vaccines up [place].  She has an appointment in early May for her pap smear and mammogram.  Declined Shingrix.  Follow Up Instructions: Return in about 6 months (around 09/23/2020) for annual physical.   I discussed the assessment and treatment plan with the patient. The patient was provided an opportunity to ask questions and all were answered. The patient agreed with the plan and demonstrated an understanding of the instructions.   The patient was advised to call back or seek an in-person evaluation if the symptoms worsen or if the condition fails to improve as anticipated.  The above assessment and management plan was discussed with the patient. The patient verbalized understanding of and has agreed to the management plan. Patient is aware to call the clinic if symptoms persist or worsen. Patient is aware when to return to the clinic for a follow-up visit. Patient educated on when it is appropriate to go to the emergency department.   Time call ended: 8:21 AM  I provided 15 minutes of face-to-face time during this encounter.   Hendricks Limes, MSN, APRN, FNP-C [facility] 03/24/20

## 2020-04-28 ENCOUNTER — Other Ambulatory Visit: Payer: Self-pay

## 2020-04-28 ENCOUNTER — Ambulatory Visit (AMBULATORY_SURGERY_CENTER): Payer: Self-pay | Admitting: *Deleted

## 2020-04-28 VITALS — Ht 63.0 in | Wt 177.0 lb

## 2020-04-28 DIAGNOSIS — Z1211 Encounter for screening for malignant neoplasm of colon: Secondary | ICD-10-CM

## 2020-04-28 MED ORDER — NA SULFATE-K SULFATE-MG SULF 17.5-3.13-1.6 GM/177ML PO SOLN
1.0000 | Freq: Once | ORAL | 0 refills | Status: AC
Start: 2020-04-28 — End: 2020-04-28

## 2020-04-28 NOTE — Progress Notes (Signed)

## 2020-05-04 ENCOUNTER — Encounter: Payer: Self-pay | Admitting: Gastroenterology

## 2020-05-12 ENCOUNTER — Other Ambulatory Visit: Payer: Self-pay

## 2020-05-12 ENCOUNTER — Encounter: Payer: Self-pay | Admitting: Gastroenterology

## 2020-05-12 ENCOUNTER — Ambulatory Visit (AMBULATORY_SURGERY_CENTER): Payer: 59 | Admitting: Gastroenterology

## 2020-05-12 VITALS — BP 103/67 | HR 67 | Temp 96.9°F | Resp 21 | Ht 63.0 in | Wt 177.0 lb

## 2020-05-12 DIAGNOSIS — Z8371 Family history of colonic polyps: Secondary | ICD-10-CM | POA: Diagnosis present

## 2020-05-12 DIAGNOSIS — Z1211 Encounter for screening for malignant neoplasm of colon: Secondary | ICD-10-CM

## 2020-05-12 MED ORDER — SODIUM CHLORIDE 0.9 % IV SOLN
500.0000 mL | Freq: Once | INTRAVENOUS | Status: DC
Start: 2020-05-12 — End: 2020-05-12

## 2020-05-12 NOTE — Patient Instructions (Signed)
Read all of the handouts given to you by your recovery room nurse.  Thank-you for choosing [place] for your healthcare needs today.  YOU HAD AN ENDOSCOPIC PROCEDURE TODAY AT THE [facility]:   Refer to the procedure report that was given to you for any specific questions about what was found during the examination.  If the procedure report does not answer your questions, please call your gastroenterologist to clarify.  If you requested that your care partner not be given the details of your procedure findings, then the procedure report has been included in a sealed envelope for you to review at your convenience later.  YOU SHOULD EXPECT: Some feelings of bloating in the abdomen. Passage of more gas than usual.  Walking can help get rid of the air that was put into your GI tract during the procedure and reduce the bloating. If you had a lower endoscopy (such as a colonoscopy or flexible sigmoidoscopy) you may notice spotting of blood in your stool or on the toilet paper. If you underwent a bowel prep for your procedure, you may not have a normal bowel movement for a few days.  Please Note:  You might notice some irritation and congestion in your nose or some drainage.  This is from the oxygen used during your procedure.  There is no need for concern and it should clear up in a day or so.  SYMPTOMS TO REPORT IMMEDIATELY:   Following lower endoscopy (colonoscopy or flexible sigmoidoscopy):  Excessive amounts of blood in the stool  Significant tenderness or worsening of abdominal pains  Swelling of the abdomen that is new, acute  Fever of 100F or higher   For urgent or emergent issues, a gastroenterologist can be reached at any hour by calling [phone]. Do not use MyChart messaging for urgent concerns.    DIET:  We do recommend a small meal at first, but then you may proceed to your regular diet.  Drink plenty of fluids but you should avoid alcoholic beverages for 24 hours. Try to  eat a high fiber diet, and drink plenty of water.  Reduce your red meat, and limit alcohol.  ACTIVITY:  You should plan to take it easy for the rest of today and you should NOT DRIVE or use heavy machinery until tomorrow (because of the sedation medicines used during the test).    FOLLOW UP: Our staff will call the number listed on your records 48-72 hours following your procedure to check on you and address any questions or concerns that you may have regarding the information given to you following your procedure. If we do not reach you, we will leave a message.  We will attempt to reach you two times.  During this call, we will ask if you have developed any symptoms of COVID 19. If you develop any symptoms (ie: fever, flu-like symptoms, shortness of breath, cough etc.) before then, please call [phone].  If you test positive for Covid 19 in the 2 weeks post procedure, please call and report this information to [place].    If any biopsies were taken you will be contacted by phone or by letter within the next 1-3 weeks.  Please call us at [phone] if you have not heard about the biopsies in 3 weeks.    SIGNATURES/CONFIDENTIALITY: You and/or your care partner have signed paperwork which will be entered into your electronic medical record.  These signatures attest to the fact that that the information above on  your After Visit Summary has been reviewed and is understood.  Full responsibility of the confidentiality of this discharge information lies with you and/or your care-partner. 

## 2020-05-12 NOTE — Progress Notes (Signed)
Pt's states no medical or surgical changes since previsit or office visit.  ° °VS DT °

## 2020-05-12 NOTE — Op Note (Signed)
[facility] Patient Name: Teresa Woodard Procedure Date: 05/12/2020 8:17 AM MRN: [id number] Endoscopist: Tressia Danas MD, MD Age: 49 Referring MD:  Date of Birth: [birth year] Gender: Female Account #: [id number] Procedure:                Colonoscopy Indications:              Colon cancer screening in patient at increased                            risk: Family history of colon polyps in multiple                            1st-degree relatives                           Father and sister with colon polyps                           No known family history of colon cancer or polyps Medicines:                Monitored Anesthesia Care Procedure:                Pre-Anesthesia Assessment:                           - Prior to the procedure, a History and Physical                            was performed, and patient medications and                            allergies were reviewed. The patient's tolerance of                            previous anesthesia was also reviewed. The risks                            and benefits of the procedure and the sedation                            options and risks were discussed with the patient.                            All questions were answered, and informed consent                            was obtained. Prior Anticoagulants: The patient has                            taken no previous anticoagulant or antiplatelet                            agents. ASA Grade Assessment: II - A patient with  mild systemic disease. After reviewing the risks                            and benefits, the patient was deemed in                            satisfactory condition to undergo the procedure.                           After obtaining informed consent, the colonoscope                            was passed under direct vision. Throughout the                            procedure, the patient's blood pressure, pulse, and                             oxygen saturations were monitored continuously. The                            Colonoscope was introduced through the anus and                            advanced to the 4 cm into the ileum. The                            colonoscopy was performed without difficulty. The                            patient tolerated the procedure well. The quality                            of the bowel preparation was good. The terminal                            ileum, ileocecal valve, appendiceal orifice, and                            rectum were photographed. Scope In: 8:33:46 AM Scope Out: 5:36:46 AM Scope Withdrawal Time: 0 hours 11 minutes 17 seconds  Total Procedure Duration: 0 hours 13 minutes 33 seconds  Findings:                 The perianal and digital rectal examinations were                            normal except for external skin tags.                           Non-bleeding internal hemorrhoids were found. The                            hemorrhoids were small.  A few small and large-mouthed diverticula were                            found in the sigmoid colon.                           The entire examined colon appeared normal on direct                            and retroflexion views.                           The terminal ileum appeared normal. Complications:            No immediate complications. Estimated Blood Loss:     Estimated blood loss: none. Impression:               - Non-bleeding internal hemorrhoids.                           - Diverticulosis in the sigmoid colon.                           - The entire examined colon is normal on direct and                            retroflexion views.                           - The examined portion of the ileum was normal.                           - No specimens collected. Recommendation:           - Patient has a contact number available for                            emergencies. The  signs and symptoms of potential                            delayed complications were discussed with the                            patient. Return to normal activities tomorrow.                            Written discharge instructions were provided to the                            patient.                           - Follow a high fiber diet. Drink at least 64                            ounces of water daily. Add a daily stool bulking  agent such as psyllium (an exampled would be                            Metamucil).                           - Continue present medications.                           - Repeat colonoscopy in 5 years given the family                            history of colon polyps.                           - Emerging evidence supports eating a diet of                            fruits, vegetables, grains, calcium, and yogurt                            while reducing red meat and alcohol may reduce the                            risk of colon cancer.                           - Thank you for allowing me to be involved in your                            colon cancer prevention. Tressia Danas MD, MD 05/12/2020 8:52:56 AM This report has been signed electronically.

## 2020-05-12 NOTE — Progress Notes (Signed)
To PACU, VSS. Report to [email or web address] 

## 2020-05-16 ENCOUNTER — Telehealth: Payer: Self-pay

## 2020-05-16 NOTE — Telephone Encounter (Signed)
°  Follow up Call-  Call back number 05/12/2020  Post procedure Call Back phone  # [phone]  Permission to leave phone message Yes  Some recent data might be hidden     Patient questions:  Do you have a fever, pain , or abdominal swelling? No. Pain Score  0 *  Have you tolerated food without any problems? Yes.    Have you been able to return to your normal activities? Yes.    Do you have any questions about your discharge instructions: Diet   No. Medications  No. Follow up visit  No.  Do you have questions or concerns about your Care? No.  Actions: * If pain score is 4 or above: No action needed, pain <4. 1. Have you developed a fever since your procedure? no  2.   Have you had an respiratory symptoms (SOB or cough) since your procedure? no  3.   Have you tested positive for COVID 19 since your procedure no  4.   Have you had any family members/close contacts diagnosed with the COVID 19 since your procedure?  no   If yes to any of these questions please route to Laverna Peace, RN and Charlett Lango, RN

## 2020-06-15 ENCOUNTER — Other Ambulatory Visit: Payer: Self-pay | Admitting: Radiology

## 2020-06-15 DIAGNOSIS — N6320 Unspecified lump in the left breast, unspecified quadrant: Secondary | ICD-10-CM

## 2020-06-20 ENCOUNTER — Other Ambulatory Visit: Payer: Self-pay

## 2020-06-20 ENCOUNTER — Ambulatory Visit
Admission: RE | Admit: 2020-06-20 | Discharge: 2020-06-20 | Disposition: A | Discharge status: Home / Self Care | Payer: 59 | Source: Ambulatory Visit | Attending: Radiology | Admitting: Radiology

## 2020-06-20 DIAGNOSIS — N6320 Unspecified lump in the left breast, unspecified quadrant: Secondary | ICD-10-CM

## 2020-06-29 ENCOUNTER — Other Ambulatory Visit: Payer: Self-pay | Admitting: Family Medicine

## 2020-06-29 DIAGNOSIS — F411 Generalized anxiety disorder: Secondary | ICD-10-CM

## 2020-06-29 NOTE — Telephone Encounter (Signed)
[facility] called stating that the quantity written is just for 1 month supply.  So pt is really due for a refill.  Called pt to confirm how she takes. Pt states that she usually takes two caps per day.  Didn't know if you needed to change the script. Thanks.

## 2020-06-30 ENCOUNTER — Other Ambulatory Visit: Payer: 59

## 2020-09-29 ENCOUNTER — Other Ambulatory Visit: Payer: Self-pay | Admitting: Family Medicine

## 2020-09-29 DIAGNOSIS — F411 Generalized anxiety disorder: Secondary | ICD-10-CM

## 2020-09-29 DIAGNOSIS — K219 Gastro-esophageal reflux disease without esophagitis: Secondary | ICD-10-CM

## 2021-01-09 ENCOUNTER — Other Ambulatory Visit: Payer: Self-pay | Admitting: Family Medicine

## 2021-01-09 DIAGNOSIS — F411 Generalized anxiety disorder: Secondary | ICD-10-CM

## 2021-01-09 DIAGNOSIS — K219 Gastro-esophageal reflux disease without esophagitis: Secondary | ICD-10-CM

## 2021-01-30 ENCOUNTER — Telehealth (INDEPENDENT_AMBULATORY_CARE_PROVIDER_SITE_OTHER): Payer: 59 | Admitting: Nurse Practitioner

## 2021-01-30 DIAGNOSIS — J0110 Acute frontal sinusitis, unspecified: Secondary | ICD-10-CM

## 2021-01-30 MED ORDER — AMOXICILLIN-POT CLAVULANATE 875-125 MG PO TABS: 1.0000 | ORAL_TABLET | Freq: Two times a day (BID) | ORAL | 0 refills | Status: DC

## 2021-01-30 MED ORDER — DM-GUAIFENESIN ER 30-600 MG PO TB12: 1.0000 | ORAL_TABLET | Freq: Two times a day (BID) | ORAL | 0 refills | Status: DC

## 2021-01-30 NOTE — Assessment & Plan Note (Signed)
Symptoms not well managed, Augmentin 875-125 mg by mouth , guaifenesin for cough and congestion, tylenol for pain/fever.  Follow up with unresolved symptoms   Addendum:  Prednisone taper added today, Rx sent to pharmacy.

## 2021-01-30 NOTE — Progress Notes (Addendum)
   Virtual Visit via telephone Note Due to COVID-19 pandemic this visit was conducted virtually. This visit type was conducted due to national recommendations for restrictions regarding the COVID-19 Pandemic (e.g. social distancing, sheltering in place) in an effort to limit this patient's exposure and mitigate transmission in our community. All issues noted in this document were discussed and addressed.  A physical exam was not performed with this format.  I connected with Teresa Woodard on 01/30/21 at 3:00 PM by telephone and verified that I am speaking with the correct person using two identifiers. Teresa Woodard is currently located at home during visit. The provider, Daryll Drown, NP is located in their office at time of visit.  I discussed the limitations, risks, security and privacy concerns of performing an evaluation and management service by telephone and the availability of in person appointments. I also discussed with the patient that there may be a patient responsible charge related to this service. The patient expressed understanding and agreed to proceed.   History and Present Illness:  Sinusitis This is a new problem. The current episode started in the past 7 days. The problem is unchanged. There has been no fever. The pain is moderate. Associated symptoms include congestion, coughing and sinus pressure. Pertinent negatives include no chills, ear pain or sore throat. Past treatments include nothing.      Review of Systems  Constitutional: Negative for chills and fever.  HENT: Positive for congestion, sinus pressure and sinus pain. Negative for ear discharge, ear pain and sore throat.   Respiratory: Positive for cough.   Cardiovascular: Negative.   Genitourinary: Negative.   Musculoskeletal: Negative.   Skin: Negative.   All other systems reviewed and are negative.    Observations/Objective: Tele-visit, patient did not sound to be in  distress  Assessment and Plan:  Subacute frontal sinusitis Symptoms not well managed, Augmentin 875-125 mg by mouth , guaifenesin for cough and congestion, tylenol for pain/fever.  Follow up with unresolved symptoms   Added prednisone taper today.  Rx sent to pharmacy.  Follow Up Instructions:   Follow up with worsening or unresolved symptoms   I discussed the assessment and treatment plan with the patient. The patient was provided an opportunity to ask questions and all were answered. The patient agreed with the plan and demonstrated an understanding of the instructions.   The patient was advised to call back or seek an in-person evaluation if the symptoms worsen or if the condition fails to improve as anticipated.  The above assessment and management plan was discussed with the patient. The patient verbalized understanding of and has agreed to the management plan. Patient is aware to call the clinic if symptoms persist or worsen. Patient is aware when to return to the clinic for a follow-up visit. Patient educated on when it is appropriate to go to the emergency department.   Time call ended:  3:08 PM  I provided  8 minutes of non-face-to-face time during this encounter.    Daryll Drown, NP

## 2021-01-31 ENCOUNTER — Telehealth: Payer: Self-pay

## 2021-01-31 ENCOUNTER — Other Ambulatory Visit: Payer: Self-pay | Admitting: Nurse Practitioner

## 2021-01-31 DIAGNOSIS — J0110 Acute frontal sinusitis, unspecified: Secondary | ICD-10-CM

## 2021-01-31 MED ORDER — PREDNISONE 10 MG (21) PO TBPK: ORAL_TABLET | ORAL | 0 refills | Status: DC

## 2021-01-31 NOTE — Telephone Encounter (Signed)
Patient aware that Prednisone has been sent to [facility].

## 2021-01-31 NOTE — Telephone Encounter (Signed)
Good morning, it will be really great if sometimes med list can be checked before messages sent out.  I did send guaifenesin for cough and congestion and an antibiotic for patient's sinusitis yesterday evening.  It is documented and showing in med list.  If patient wants prednisone I will definitely send that to the pharmacy right now thank you.  And I did also encourage patient to use her breathing treatments.

## 2021-02-06 ENCOUNTER — Ambulatory Visit (INDEPENDENT_AMBULATORY_CARE_PROVIDER_SITE_OTHER): Payer: 59

## 2021-02-06 ENCOUNTER — Telehealth: Payer: Self-pay

## 2021-02-06 ENCOUNTER — Ambulatory Visit (INDEPENDENT_AMBULATORY_CARE_PROVIDER_SITE_OTHER): Payer: 59 | Admitting: Family Medicine

## 2021-02-06 ENCOUNTER — Encounter: Payer: Self-pay | Admitting: Family Medicine

## 2021-02-06 VITALS — BP 127/82 | HR 80 | Temp 97.7°F

## 2021-02-06 DIAGNOSIS — R062 Wheezing: Secondary | ICD-10-CM

## 2021-02-06 DIAGNOSIS — Z8616 Personal history of COVID-19: Secondary | ICD-10-CM | POA: Diagnosis not present

## 2021-02-06 DIAGNOSIS — R053 Chronic cough: Secondary | ICD-10-CM

## 2021-02-06 MED ORDER — BENZONATATE 100 MG PO CAPS: 100.0000 mg | ORAL_CAPSULE | Freq: Three times a day (TID) | ORAL | 2 refills | Status: DC | PRN

## 2021-02-06 MED ORDER — BUDESONIDE-FORMOTEROL FUMARATE 160-4.5 MCG/ACT IN AERO: 2.0000 | INHALATION_SPRAY | Freq: Two times a day (BID) | RESPIRATORY_TRACT | 99 refills | Status: DC

## 2021-02-06 MED ORDER — PROMETHAZINE-DM 6.25-15 MG/5ML PO SYRP: 2.5000 mL | ORAL_SOLUTION | Freq: Every evening | ORAL | 0 refills | Status: DC | PRN

## 2021-02-06 NOTE — Patient Instructions (Signed)

## 2021-02-06 NOTE — Telephone Encounter (Signed)
Patient was seen by Je on 01/30/21 for sinusitis and was prescribed Augmentin and 5 day prednisone taper.  She reports that she is still having cough and congestion.  Shortness of breath and some wheezing last week but feels it is improved but not completely gone.  Appointment scheduled today in Covid clinic at 4:15 pm with Dr. Nadine Counts.

## 2021-02-06 NOTE — Progress Notes (Signed)
Subjective: CC: Cough PCP: Gwenlyn Fudge, FNP VQM:GQQPYP Teresa Woodard is a 50 y.o. female presenting to clinic today for:  1.  Cough Patient reports ongoing cough that has been present for the last couple of days.  She is actually sick with COVID-19 over a month ago and symptoms have totally resolved but she suddenly started with this productive/intermittently dry cough.  She reports it is harsh and often keeps her up at night.  It was so harsh yesterday but actually seems to be slightly better today.  She is been using over-the-counter products but has not had much relief with these over-the-counter products.  She was treated with steroids.  Not currently on any inhaled corticosteroids; she just finished her antibiotics.   ROS: Per HPI  No Known Allergies Past Medical History:  Diagnosis Date  . Allergy   . Asthma     Current Outpatient Medications:  .  albuterol (VENTOLIN HFA) 108 (90 Base) MCG/ACT inhaler, Inhale into the lungs every 6 (six) hours as needed., Disp: , Rfl:  .  albuterol (VENTOLIN HFA) 108 (90 Base) MCG/ACT inhaler, Ventolin HFA 90 mcg/actuation aerosol inhaler  USE 2 PUFFS EVERY 4 TO 6 HOURS AS NEEDED FOR COUGH, WHEEZE,CHEST TIGHTNESS, SHORTNESS OF BREATH, Disp: , Rfl:  .  amoxicillin-clavulanate (AUGMENTIN) 875-125 MG tablet, Take 1 tablet by mouth 2 (two) times daily., Disp: 14 tablet, Rfl: 0 .  Ascorbic Acid (VITAMIN C WITH ROSE HIPS) 1000 MG tablet, Take 1,000 mg by mouth daily., Disp: , Rfl:  .  cetirizine (ZYRTEC) 10 MG tablet, Take 10 mg by mouth daily., Disp: , Rfl:  .  cholecalciferol (VITAMIN D) 1000 units tablet, Take 1,000 Units by mouth daily., Disp: , Rfl:  .  dextromethorphan-guaiFENesin (MUCINEX DM) 30-600 MG 12hr tablet, Take 1 tablet by mouth 2 (two) times daily., Disp: 30 tablet, Rfl: 0 .  FLUoxetine (PROZAC) 20 MG capsule, Take 1 capsule (20 mg total) by mouth at bedtime. (Needs to be seen before next refill), Disp: 30 capsule, Rfl: 0 .   fluticasone (FLONASE) 50 MCG/ACT nasal spray, Place 2 sprays into both nostrils daily., Disp: , Rfl:  .  hydrOXYzine (VISTARIL) 25 MG capsule, TAKE 1 CAPSULE UP TO 3 TIMES A DAY AS NEEDED FOR ANXIETY, Disp: 180 capsule, Rfl: 2 .  Multiple Vitamin (MULTIVITAMIN) tablet, Take 1 tablet by mouth daily., Disp: , Rfl:  .  neomycin-polymyxin b-dexamethasone (MAXITROL) 3.5-10000-0.1 OINT, APPLY 1 INCH INTO LOWER RIGHT EYELID AT BEDTIME FOR 1 TO 2 WEEKS, Disp: , Rfl:  .  pantoprazole (PROTONIX) 40 MG tablet, Take 1 tablet (40 mg total) by mouth daily. (Needs to be seen before next refill), Disp: 30 tablet, Rfl: 0 .  predniSONE (STERAPRED UNI-PAK 21 TAB) 10 MG (21) TBPK tablet, 6 tablets day 1, 5 tablet day 2, 4 tablet day 3, 3 tablet daily four, 2 tablet day 5, 1 tablet day 6., Disp: 1 each, Rfl: 0 .  SYMBICORT 160-4.5 MCG/ACT inhaler, , Disp: , Rfl:  .  Zinc 50 MG CAPS, Take 1 capsule by mouth daily., Disp: , Rfl:  Social History   Socioeconomic History  . Marital status: Married    Spouse name: Not on file  . Number of children: Not on file  . Years of education: Not on file  . Highest education level: Not on file  Occupational History  . Not on file  Tobacco Use  . Smoking status: Never Smoker  . Smokeless tobacco: Never Used  Vaping Use  .  Vaping Use: Never used  Substance and Sexual Activity  . Alcohol use: No  . Drug use: No  . Sexual activity: Not on file  Other Topics Concern  . Not on file  Social History Narrative  . Not on file   Social Determinants of Health   Financial Resource Strain: Not on file  Food Insecurity: Not on file  Transportation Needs: Not on file  Physical Activity: Not on file  Stress: Not on file  Social Connections: Not on file  Intimate Partner Violence: Not on file   Family History  Problem Relation Age of Onset  . Arthritis Mother   . Cancer Mother        breast, uterine,lung  . Breast cancer Mother 50  . Breast cancer Paternal Aunt   .  Colitis Neg Hx   . Esophageal cancer Neg Hx   . Stomach cancer Neg Hx   . Rectal cancer Neg Hx     Objective: Office vital signs reviewed. BP 127/82   Pulse 80   Temp 97.7 F (36.5 C) (Temporal)   SpO2 94%   Physical Examination:  General: Awake, alert, well nourished, No acute distress HEENT: Normal; sclera white Cardio: regular rate and rhythm, S1S2 heard, no murmurs appreciated Pulm: clear to auscultation bilaterally, no wheezes, rhonchi or rales; normal work of breathing on room air  Assessment/ Plan: 50 y.o. female   Persistent cough - Plan: DG Chest 2 View, benzonatate (TESSALON PERLES) 100 MG capsule, promethazine-dextromethorphan (PROMETHAZINE-DM) 6.25-15 MG/5ML syrup, budesonide-formoterol (SYMBICORT) 160-4.5 MCG/ACT inhaler  Personal history of COVID-19  Wheezing - Plan: budesonide-formoterol (SYMBICORT) 160-4.5 MCG/ACT inhaler  Chest x-ray personally reviewed and I cannot appreciate any acute pulmonary infiltrates.  Awaiting formal review by radiology.  Suspect that she has ongoing bronchospasm and thus I have sent in Symbicort inhaled twice daily as needed.  Tessalon Perles and Promethazine DM sent into pharmacy.  She will follow-up as needed.  No orders of the defined types were placed in this encounter.  No orders of the defined types were placed in this encounter.    Raliegh Ip, DO [facility] [phone]

## 2021-04-09 ENCOUNTER — Other Ambulatory Visit: Payer: Self-pay | Admitting: Family Medicine

## 2021-04-09 DIAGNOSIS — F411 Generalized anxiety disorder: Secondary | ICD-10-CM

## 2021-04-09 DIAGNOSIS — K219 Gastro-esophageal reflux disease without esophagitis: Secondary | ICD-10-CM

## 2021-04-25 ENCOUNTER — Other Ambulatory Visit: Payer: Self-pay | Admitting: Family Medicine

## 2021-04-25 DIAGNOSIS — R053 Chronic cough: Secondary | ICD-10-CM

## 2021-05-07 ENCOUNTER — Other Ambulatory Visit: Payer: Self-pay | Admitting: Family Medicine

## 2021-05-07 DIAGNOSIS — K219 Gastro-esophageal reflux disease without esophagitis: Secondary | ICD-10-CM

## 2021-05-08 NOTE — Telephone Encounter (Signed)
Teresa Woodard. NTBS 30 days given 04/09/21

## 2021-05-14 ENCOUNTER — Other Ambulatory Visit: Payer: Self-pay | Admitting: Family Medicine

## 2021-05-14 DIAGNOSIS — K219 Gastro-esophageal reflux disease without esophagitis: Secondary | ICD-10-CM

## 2021-06-06 ENCOUNTER — Ambulatory Visit (INDEPENDENT_AMBULATORY_CARE_PROVIDER_SITE_OTHER): Payer: 59 | Admitting: Family Medicine

## 2021-06-06 ENCOUNTER — Encounter: Payer: Self-pay | Admitting: Family Medicine

## 2021-06-06 ENCOUNTER — Other Ambulatory Visit: Payer: Self-pay

## 2021-06-06 VITALS — BP 123/83 | HR 70 | Temp 98.2°F | Ht 63.0 in | Wt 194.2 lb

## 2021-06-06 DIAGNOSIS — J4520 Mild intermittent asthma, uncomplicated: Secondary | ICD-10-CM

## 2021-06-06 DIAGNOSIS — K219 Gastro-esophageal reflux disease without esophagitis: Secondary | ICD-10-CM

## 2021-06-06 DIAGNOSIS — E669 Obesity, unspecified: Secondary | ICD-10-CM

## 2021-06-06 DIAGNOSIS — L639 Alopecia areata, unspecified: Secondary | ICD-10-CM

## 2021-06-06 DIAGNOSIS — Z0000 Encounter for general adult medical examination without abnormal findings: Secondary | ICD-10-CM | POA: Diagnosis not present

## 2021-06-06 DIAGNOSIS — E559 Vitamin D deficiency, unspecified: Secondary | ICD-10-CM

## 2021-06-06 DIAGNOSIS — F411 Generalized anxiety disorder: Secondary | ICD-10-CM

## 2021-06-06 DIAGNOSIS — Z23 Encounter for immunization: Secondary | ICD-10-CM | POA: Diagnosis not present

## 2021-06-06 DIAGNOSIS — M7989 Other specified soft tissue disorders: Secondary | ICD-10-CM

## 2021-06-06 DIAGNOSIS — E66811 Obesity, class 1: Secondary | ICD-10-CM

## 2021-06-06 MED ORDER — PANTOPRAZOLE SODIUM 40 MG PO TBEC: 40.0000 mg | DELAYED_RELEASE_TABLET | Freq: Every day | ORAL | 3 refills | Status: DC

## 2021-06-06 MED ORDER — ALBUTEROL SULFATE HFA 108 (90 BASE) MCG/ACT IN AERS: 2.0000 | INHALATION_SPRAY | Freq: Four times a day (QID) | RESPIRATORY_TRACT | 3 refills | Status: DC | PRN

## 2021-06-06 MED ORDER — HYDROXYZINE PAMOATE 25 MG PO CAPS: 25.0000 mg | ORAL_CAPSULE | Freq: Three times a day (TID) | ORAL | 3 refills | Status: DC | PRN

## 2021-06-06 NOTE — Progress Notes (Signed)
Assessment & Plan:  1. Well adult exam Preventive health education provided. Upcoming appointment with OBGYN for pap smear and mammogram. Declined COVID booster, Shingrix, and hepatitis C screening. - CBC with Differential/Platelet - CMP14+EGFR - Lipid panel  2. GAD (generalized anxiety disorder) Well controlled on current regimen.  - hydrOXYzine (VISTARIL) 25 MG capsule; Take 1 capsule (25 mg total) by mouth 3 (three) times daily as needed for anxiety.  Dispense: 90 capsule; Refill: 3 - CMP14+EGFR  3. GERD without esophagitis Well controlled on current regimen.  - pantoprazole (PROTONIX) 40 MG tablet; Take 1 tablet (40 mg total) by mouth daily.  Dispense: 90 tablet; Refill: 3 - CMP14+EGFR  4. Obesity (BMI 30.0-34.9) Patient is going to schedule a visit with our clinical pharmacist to discuss [place]. Encouraged diet and exercise at least 30 minutes 5 days per week.  - CBC with Differential/Platelet - CMP14+EGFR - Lipid panel  5. Vitamin D deficiency Labs to assess. - VITAMIN D 25 Hydroxy (Vit-D Deficiency, Fractures)  6. Mild intermittent asthma without complication Well controlled on current regimen.  - albuterol (VENTOLIN HFA) 108 (90 Base) MCG/ACT inhaler; Inhale 2 puffs into the lungs every 6 (six) hours as needed for wheezing or shortness of breath.  Dispense: 18 g; Refill: 3  7. Swelling of both lower extremities Resolved. - TSH  8. Alopecia areata - TSH  9. Immunization due - Pneumococcal polysaccharide vaccine 23-valent greater than or equal to 2yo subcutaneous/IM - given in office.   Follow-up: Return in about 1 year (around 06/06/2022) for annual physical .   Hendricks Limes, MSN, APRN, FNP-C [facility]  Subjective:  Patient ID: Teresa Woodard, female    DOB: January 26, 1971  Age: 50 y.o. MRN: [id number]  Patient Care Team: Loman Brooklyn, FNP as PCP - General (Family Medicine)   CC:  Chief Complaint  Patient presents with    Annual Exam    HPI Teresa Woodard presents for her annual physical.  Occupation: Works in an [employer] office, Marital status: Married, Substance use: None Diet: Low carb, small portions, lots of water, Exercise: walking Last eye exam: a few months ago Last dental exam: a couple of years ago Last colonoscopy: 05/12/2020 with repeat in 5 years Last mammogram: 2016 Last pap smear: 2016 Hepatitis C Screening: Declined Immunizations: Flu Vaccine:  not flu season Tdap Vaccine: up to date  Shingrix Vaccine: declined  COVID-19 Vaccine: up to date Pneumonia Vaccine:  agreeable to get today due to asthma  DEPRESSION SCREENING Depression screen [facility] 2/9 06/06/2021 09/29/2019 08/27/2019  Decreased Interest 0 0 0  Down, Depressed, Hopeless 0 0 0  PHQ - 2 Score 0 0 0  Altered sleeping 1 1 -  Tired, decreased energy 1 1 -  Change in appetite - 0 -  Feeling bad or failure about yourself  0 0 -  Trouble concentrating 0 0 -  Moving slowly or fidgety/restless 0 0 -  Suicidal thoughts 0 0 -  PHQ-9 Score 2 2 -  Difficult doing work/chores Not difficult at all - -    GAD 7 : Generalized Anxiety Score 06/06/2021 03/24/2020 09/29/2019 08/27/2019  Nervous, Anxious, on Edge $Remov'1 1 1 2  'BnwVRr$ Control/stop worrying 0 0 1 2  Worry too much - different things 0 0 1 2  Trouble relaxing 0 $RemoveBe'1 1 2  'qVBtmfoXU$ Restless 0 0 0 0  Easily annoyed or irritable 0 0 1 1  Afraid - awful might happen 0 0 0 0  Total GAD 7 Score $Remov'1 2 5 9  'NzQGUz$ Anxiety Difficulty Not difficult at all Not difficult at all - -     Asthma: only requires Albuterol when she is sick.    Review of Systems  Constitutional:  Negative for chills, fever, malaise/fatigue and weight loss.  HENT:  Negative for congestion, ear discharge, ear pain, nosebleeds, sinus pain, sore throat and tinnitus.        Hair loss.  Eyes:  Negative for blurred vision, double vision, pain, discharge and redness.  Respiratory:  Negative for cough, shortness of breath and wheezing.    Cardiovascular:  Positive for leg swelling (when she was at [facility] - now resolved). Negative for chest pain and palpitations.       Tachycardia at times.  Gastrointestinal:  Negative for abdominal pain, constipation, diarrhea, heartburn, nausea and vomiting.  Genitourinary:  Negative for dysuria, frequency and urgency.  Musculoskeletal:  Negative for myalgias.  Skin:  Negative for rash.  Neurological:  Negative for dizziness, seizures, weakness and headaches.  Psychiatric/Behavioral:  Negative for depression, substance abuse and suicidal ideas. The patient is not nervous/anxious.     Current Outpatient Medications:    albuterol (VENTOLIN HFA) 108 (90 Base) MCG/ACT inhaler, Ventolin HFA 90 mcg/actuation aerosol inhaler  USE 2 PUFFS EVERY 4 TO 6 HOURS AS NEEDED FOR COUGH, WHEEZE,CHEST TIGHTNESS, SHORTNESS OF BREATH, Disp: , Rfl:    Ascorbic Acid (VITAMIN C WITH ROSE HIPS) 1000 MG tablet, Take 1,000 mg by mouth daily., Disp: , Rfl:    benzonatate (TESSALON PERLES) 100 MG capsule, Take 1 capsule (100 mg total) by mouth 3 (three) times daily as needed for cough., Disp: 20 capsule, Rfl: 2   budesonide-formoterol (SYMBICORT) 160-4.5 MCG/ACT inhaler, Inhale 2 puffs into the lungs in the morning and at bedtime., Disp: 1 each, Rfl: prn   cetirizine (ZYRTEC) 10 MG tablet, Take 10 mg by mouth daily., Disp: , Rfl:    cholecalciferol (VITAMIN D) 1000 units tablet, Take 1,000 Units by mouth daily., Disp: , Rfl:    fluticasone (FLONASE) 50 MCG/ACT nasal spray, Place 2 sprays into both nostrils daily., Disp: , Rfl:    hydrOXYzine (VISTARIL) 25 MG capsule, TAKE 1 CAPSULE UP TO 3 TIMES A DAY AS NEEDED FOR ANXIETY (NEEDS TO BE SEEN BEFORE NEXT REFILL), Disp: 90 capsule, Rfl: 0   Multiple Vitamin (MULTIVITAMIN) tablet, Take 1 tablet by mouth daily., Disp: , Rfl:    pantoprazole (PROTONIX) 40 MG tablet, TAKE 1 TABLET DAILY, Disp: 30 tablet, Rfl: 0   Zinc 50 MG CAPS, Take 1 capsule by mouth daily., Disp: , Rfl:    No Known Allergies  Past Medical History:  Diagnosis Date   Allergy    Asthma     Past Surgical History:  Procedure Laterality Date   TUBAL LIGATION      Family History  Problem Relation Age of Onset   Arthritis Mother    Cancer Mother        breast, uterine,lung   Breast cancer Mother 60   Breast cancer Paternal Aunt    Colitis Neg Hx    Esophageal cancer Neg Hx    Stomach cancer Neg Hx    Rectal cancer Neg Hx     Social History   Socioeconomic History   Marital status: Married    Spouse name: Not on file   Number of children: Not on file   Years of education: Not on file   Highest education level: Not on file  Occupational  History   Not on file  Tobacco Use   Smoking status: Never   Smokeless tobacco: Never  Vaping Use   Vaping Use: Never used  Substance and Sexual Activity   Alcohol use: No   Drug use: No   Sexual activity: Not on file  Other Topics Concern   Not on file  Social History Narrative   Not on file   Social Determinants of Health   Financial Resource Strain: Not on file  Food Insecurity: Not on file  Transportation Needs: Not on file  Physical Activity: Not on file  Stress: Not on file  Social Connections: Not on file  Intimate Partner Violence: Not on file      Objective:    BP 123/83   Pulse 70   Temp 98.2 F (36.8 C) (Temporal)   Ht $R'5\' 3"'Qx$  (1.6 m)   Wt 194 lb 3.2 oz (88.1 kg)   SpO2 96%   BMI 34.40 kg/m   Wt Readings from Last 3 Encounters:  06/06/21 194 lb 3.2 oz (88.1 kg)  05/12/20 177 lb (80.3 kg)  04/28/20 177 lb (80.3 kg)    Physical Exam Vitals reviewed.  Constitutional:      General: She is not in acute distress.    Appearance: Normal appearance. She is obese. She is not ill-appearing, toxic-appearing or diaphoretic.  HENT:     Head: Normocephalic and atraumatic.     Right Ear: Tympanic membrane, ear canal and external ear normal. There is no impacted cerumen.     Left Ear: Tympanic membrane, ear canal  and external ear normal. There is no impacted cerumen.     Nose: Nose normal. No congestion or rhinorrhea.     Mouth/Throat:     Mouth: Mucous membranes are moist.     Pharynx: Oropharynx is clear. No oropharyngeal exudate or posterior oropharyngeal erythema.  Eyes:     General: No scleral icterus.       Right eye: No discharge.        Left eye: No discharge.     Conjunctiva/sclera: Conjunctivae normal.     Pupils: Pupils are equal, round, and reactive to light.  Cardiovascular:     Rate and Rhythm: Normal rate and regular rhythm.     Heart sounds: Normal heart sounds. No murmur heard.   No friction rub. No gallop.  Pulmonary:     Effort: Pulmonary effort is normal. No respiratory distress.     Breath sounds: Normal breath sounds. No stridor. No wheezing, rhonchi or rales.  Abdominal:     General: Abdomen is flat. Bowel sounds are normal. There is no distension.     Palpations: Abdomen is soft. There is no mass.     Tenderness: There is no abdominal tenderness. There is no guarding or rebound.     Hernia: No hernia is present.  Musculoskeletal:        General: Normal range of motion.     Cervical back: Normal range of motion and neck supple. No rigidity. No muscular tenderness.  Lymphadenopathy:     Cervical: No cervical adenopathy.  Skin:    General: Skin is warm and dry.     Capillary Refill: Capillary refill takes less than 2 seconds.  Neurological:     General: No focal deficit present.     Mental Status: She is alert and oriented to person, place, and time. Mental status is at baseline.  Psychiatric:        Mood and Affect: Mood  normal.        Behavior: Behavior normal.        Thought Content: Thought content normal.        Judgment: Judgment normal.    Lab Results  Component Value Date   TSH 1.170 08/27/2019   Lab Results  Component Value Date   WBC 7.5 08/27/2019   HGB 13.6 08/27/2019   HCT 38.9 08/27/2019   MCV 89 08/27/2019   PLT 275 08/27/2019   Lab  Results  Component Value Date   NA 139 08/27/2019   K 4.2 08/27/2019   CO2 22 08/27/2019   GLUCOSE 88 08/27/2019   BUN 14 08/27/2019   CREATININE 0.98 08/27/2019   BILITOT 0.4 08/27/2019   ALKPHOS 82 08/27/2019   AST 23 08/27/2019   ALT 29 08/27/2019   PROT 6.6 08/27/2019   ALBUMIN 4.4 08/27/2019   CALCIUM 9.6 08/27/2019   Lab Results  Component Value Date   CHOL 164 08/27/2019   Lab Results  Component Value Date   HDL 42 08/27/2019   Lab Results  Component Value Date   LDLCALC 100 (H) 08/27/2019   Lab Results  Component Value Date   TRIG 121 08/27/2019   Lab Results  Component Value Date   CHOLHDL 3.9 08/27/2019   No results found for: HGBA1C

## 2021-06-06 NOTE — Patient Instructions (Signed)
Preventive Care 51-51 Years Oldears Old, Female Preventive care refers to lifestyle choices and visits with your health care provider that can promote health and wellness. This includes: A yearly physical exam. This is also called an annual wellness visit. Regular dental and eye exams. Immunizations. Screening for certain conditions. Healthy lifestyle choices, such as: Eating a healthy diet. Getting regular exercise. Not using drugs or products that contain nicotine and tobacco. Limiting alcohol use. What can I expect for my preventive care visit? Physical exam Your health care provider will check your: Height and weight. These may be used to calculate your BMI (body mass index). BMI is a measurement that tells if you are at a healthy weight. Heart rate and blood pressure. Body temperature. Skin for abnormal spots. Counseling Your health care provider may ask you questions about your: Past medical problems. Family's medical history. Alcohol, tobacco, and drug use. Emotional well-being. Home life and relationship well-being. Sexual activity. Diet, exercise, and sleep habits. Work and work Statistician. Access to firearms. Method of birth control. Menstrual cycle. Pregnancy history. What immunizations do I need?  Vaccines are usually given at various ages, according to a schedule. Your health care provider will recommend vaccines for you based on your age, medicalhistory, and lifestyle or other factors, such as travel or where you work. What tests do I need? Blood tests Lipid and cholesterol levels. These may be checked every 5 years, or more often if you are over 51 years old. Hepatitis C test. Hepatitis B test. Screening Lung cancer screening. You may have this screening every year starting at age 51 if you have a 30-pack-year history of smoking and currently smoke or have quit within the past 15 years. Colorectal cancer screening. All adults should have this screening starting at  age 56 and continuing until age 51. Your health care provider may recommend screening at age 51 if you are at increased risk. You will have tests every 1-10 years, depending on your results and the type of screening test. Diabetes screening. This is done by checking your blood sugar (glucose) after you have not eaten for a while (fasting). You may have this done every 1-3 years. Mammogram. This may be done every 1-2 years. Talk with your health care provider about when you should start having regular mammograms. This may depend on whether you have a family history of breast cancer. BRCA-related cancer screening. This may be done if you have a family history of breast, ovarian, tubal, or peritoneal cancers. Pelvic exam and Pap test. This may be done every 3 years starting at age 51. Starting at age 78, this may be done every 5 years if you have a Pap test in combination with an HPV test. Starting at age 51 in combination with an HPV test. Other tests STD (sexually transmitted disease) testing, if you are at risk. Bone density scan. This is done to screen for osteoporosis. You may have this scan if you are at high risk for osteoporosis. Talk with your health care provider about your test results, treatment options,and if necessary, the need for more tests. Follow these instructions at home: Eating and drinking  Eat a diet that includes fresh fruits and vegetables, whole grains, lean protein, and low-fat dairy products. Take vitamin and mineral supplements as recommended by your health care provider. Do not drink alcohol if: Your health care provider tells you not to drink. You are pregnant, may be pregnant, or are planning to become pregnant. If you drink alcohol: Limit how much you have to 0-1 drink a day. Be aware  of how much alcohol is in your drink. In the [place] one drink equals one 12 oz bottle of beer (355 mL), one 5 oz glass of wine (148 mL), or one 1 oz glass of hard liquor (44 mL).  Lifestyle Take daily care of your teeth and  gums. Brush your teeth every morning and night with fluoride toothpaste. Floss one time each day. Stay active. Exercise for at least 30 minutes 5 or more days each week. Do not use any products that contain nicotine or tobacco, such as cigarettes, e-cigarettes, and chewing tobacco. If you need help quitting, ask your health care provider. Do not use drugs. If you are sexually active, practice safe sex. Use a condom or other form of protection to prevent STIs (sexually transmitted infections). If you do not wish to become pregnant, use a form of birth control. If you plan to become pregnant, see your health care provider for a prepregnancy visit. If told by your health care provider, take low-dose aspirin daily starting at age 51. Find healthy ways to cope with stress, such as: Meditation, yoga, or listening to music. Journaling. Talking to a trusted person. Spending time with friends and family. Safety Always wear your seat belt while driving or riding in a vehicle. Do not drive: If you have been drinking alcohol. Do not ride with someone who has been drinking. When you are tired or distracted. While texting. Wear a helmet and other protective equipment during sports activities. If you have firearms in your house, make sure you follow all gun safety procedures. What's next? Visit your health care provider once a year for an annual wellness visit. Ask your health care provider how often you should have your eyes and teeth checked. Stay up to date on all vaccines. This information is not intended to replace advice given to you by your health care provider. Make sure you discuss any questions you have with your healthcare provider. Document Revised: 08/22/2020 Document Reviewed: 07/30/2018 Elsevier Patient Education  2022 [employer].

## 2021-06-07 LAB — LIPID PANEL
Chol/HDL Ratio: 4.4 ratio (ref 0.0–4.4)
Cholesterol, Total: 180 mg/dL (ref 100–199)
HDL: 41 mg/dL (ref >39)
LDL Chol Calc (NIH): 116 mg/dL — ABNORMAL HIGH (ref 0–99)
Triglycerides: 128 mg/dL (ref 0–149)
VLDL Cholesterol Cal: 23 mg/dL (ref 5–40)

## 2021-06-07 LAB — CBC WITH DIFFERENTIAL/PLATELET
Basophils Absolute: 0.1 10*3/uL (ref 0.0–0.2)
Basos: 1 %
EOS (ABSOLUTE): 0.4 10*3/uL (ref 0.0–0.4)
Eos: 6 %
Hematocrit: 43.1 % (ref 34.0–46.6)
Hemoglobin: 14.4 g/dL (ref 11.1–15.9)
Immature Grans (Abs): 0 10*3/uL (ref 0.0–0.1)
Immature Granulocytes: 0 %
Lymphocytes Absolute: 1.7 10*3/uL (ref 0.7–3.1)
Lymphs: 24 %
MCH: 30 pg (ref 26.6–33.0)
MCHC: 33.4 g/dL (ref 31.5–35.7)
MCV: 90 fL (ref 79–97)
Monocytes Absolute: 0.7 10*3/uL (ref 0.1–0.9)
Monocytes: 10 %
Neutrophils Absolute: 4.1 10*3/uL (ref 1.4–7.0)
Neutrophils: 59 %
Platelets: 225 10*3/uL (ref 150–450)
RBC: 4.8 x10E6/uL (ref 3.77–5.28)
RDW: 13.2 % (ref 11.7–15.4)
WBC: 7 10*3/uL (ref 3.4–10.8)

## 2021-06-07 LAB — CMP14+EGFR
ALT: 46 IU/L — ABNORMAL HIGH (ref 0–32)
AST: 31 IU/L (ref 0–40)
Albumin/Globulin Ratio: 1.8 (ref 1.2–2.2)
Albumin: 4.2 g/dL (ref 3.8–4.9)
Alkaline Phosphatase: 75 IU/L (ref 44–121)
BUN/Creatinine Ratio: 14 (ref 9–23)
BUN: 12 mg/dL (ref 6–24)
Bilirubin Total: 0.4 mg/dL (ref 0.0–1.2)
CO2: 21 mmol/L (ref 20–29)
Calcium: 9.1 mg/dL (ref 8.7–10.2)
Chloride: 103 mmol/L (ref 96–106)
Creatinine, Ser: 0.87 mg/dL (ref 0.57–1.00)
Globulin, Total: 2.3 g/dL (ref 1.5–4.5)
Glucose: 94 mg/dL (ref 65–99)
Potassium: 4.3 mmol/L (ref 3.5–5.2)
Sodium: 141 mmol/L (ref 134–144)
Total Protein: 6.5 g/dL (ref 6.0–8.5)
eGFR: 81 mL/min/{1.73_m2} (ref >59)

## 2021-06-07 LAB — VITAMIN D 25 HYDROXY (VIT D DEFICIENCY, FRACTURES): Vit D, 25-Hydroxy: 38.8 ng/mL (ref 30.0–100.0)

## 2021-06-07 LAB — TSH: TSH: 1.64 u[IU]/mL (ref 0.450–4.500)

## 2021-06-21 ENCOUNTER — Ambulatory Visit (INDEPENDENT_AMBULATORY_CARE_PROVIDER_SITE_OTHER): Payer: 59 | Admitting: Pharmacist

## 2021-06-21 ENCOUNTER — Other Ambulatory Visit: Payer: Self-pay

## 2021-06-21 DIAGNOSIS — E669 Obesity, unspecified: Secondary | ICD-10-CM | POA: Diagnosis not present

## 2021-06-21 MED ORDER — TIRZEPATIDE 5 MG/0.5ML ~~LOC~~ SOAJ: 5.0000 mg | SUBCUTANEOUS | 2 refills | Status: DC

## 2021-06-21 NOTE — Progress Notes (Signed)
      06/21/2021 Name: Teresa Woodard  MRN: [id number]      DOB: [birth year]    S:  50 yoF Presents for weight loss evaluation, education, and management.  She would like to lose weight and interested in [place].  She currently weighs 194lbs.  Current BMI is 33.89.  She meets criteria for metabolic syndrome.   Insurance coverage/medication affordability: [employer] commercial   Patient reports adherence with medications. Current medications for weight loss: n/a   Patient is active during the day.  She appears motivated and ready to lose weight.   Discussed meal planning options and Plate method for healthy eating EAT SMALLER MEALS STOP EATING WHEN YOU FEEL FULL AVOID High FAT, High sugar FOODS INCREASE YOUR WATER INTAKE DECREASE INTAKE OF CARBONATED BEVERAGES TO AVOID GI SIDE EFFECTS   Goal weight is around 150-160lbs per patient report     A/P:   -Healthy eating and meal planning discussed   -Increase water and exercise   -Sample pack given MOUNJARO 2.5MG  (4 week trial) Will call in Mounjaro 5mg  sq weekly for subsequent fill to [facility] --coapy card given Counseled on contraception to switch to a non-oral contraceptive method, or add a barrier method of contraception for 4 weeks after initiation and for 4 weeks after each dose escalation Denies personal and family history of Medullary thyroid cancer (MTC)             Work to eat low fat, smaller meals to reduce side effects             Decreased carbonated beverages   Written patient instructions provided.  Total time in face to face counseling 25 minutes.   [facility], PharmD, BCPS Clinical Pharmacist, [facility]  II Phone [phone]

## 2021-07-04 ENCOUNTER — Other Ambulatory Visit: Payer: Self-pay

## 2021-07-04 ENCOUNTER — Ambulatory Visit (INDEPENDENT_AMBULATORY_CARE_PROVIDER_SITE_OTHER): Payer: 59 | Admitting: Family Medicine

## 2021-07-04 ENCOUNTER — Encounter: Payer: Self-pay | Admitting: Family Medicine

## 2021-07-04 VITALS — BP 105/69 | HR 78 | Temp 97.9°F | Ht 63.0 in | Wt 185.6 lb

## 2021-07-04 DIAGNOSIS — L02412 Cutaneous abscess of left axilla: Secondary | ICD-10-CM

## 2021-07-04 MED ORDER — SULFAMETHOXAZOLE-TRIMETHOPRIM 800-160 MG PO TABS: 1.0000 | ORAL_TABLET | Freq: Two times a day (BID) | ORAL | 0 refills | Status: AC

## 2021-07-04 NOTE — Progress Notes (Signed)
Assessment & Plan:  1. Abscess of left axilla Education provided on abscesses. Warm compresses QID. Encouraged to promote drainage if it starts. Keep clean and dry. Rx'd Bactrim.  - sulfamethoxazole-trimethoprim (BACTRIM DS) 800-160 MG tablet; Take 1 tablet by mouth 2 (two) times daily for 7 days.  Dispense: 14 tablet; Refill: 0   Follow up plan: Return if symptoms worsen or fail to improve.  Deliah Boston, MSN, APRN, FNP-C [facility]  Subjective:   Patient ID: Teresa Woodard, female    DOB: 06-14-1971, 50 y.o.   MRN: [id number]  HPI: Teresa Woodard is a 50 y.o. female presenting on 07/04/2021 for [place] (Patient states she has a lump on her left axilla x 4 days that has gotten bigger )  Patient reports a bump under her left axilla that has been present x4 days. It is getting bigger. Tender to the touch. She has been applying a warm washcloth. No drainage.   ROS: Negative unless specifically indicated above in HPI.   Relevant past medical history reviewed and updated as indicated.   Allergies and medications reviewed and updated.   Current Outpatient Medications:    albuterol (VENTOLIN HFA) 108 (90 Base) MCG/ACT inhaler, Inhale 2 puffs into the lungs every 6 (six) hours as needed for wheezing or shortness of breath., Disp: 18 g, Rfl: 3   Ascorbic Acid (VITAMIN C WITH ROSE HIPS) 1000 MG tablet, Take 1,000 mg by mouth daily., Disp: , Rfl:    benzonatate (TESSALON PERLES) 100 MG capsule, Take 1 capsule (100 mg total) by mouth 3 (three) times daily as needed for cough., Disp: 20 capsule, Rfl: 2   cetirizine (ZYRTEC) 10 MG tablet, Take 10 mg by mouth daily., Disp: , Rfl:    cholecalciferol (VITAMIN D) 1000 units tablet, Take 1,000 Units by mouth daily., Disp: , Rfl:    fluticasone (FLONASE) 50 MCG/ACT nasal spray, Place 2 sprays into both nostrils daily., Disp: , Rfl:    hydrOXYzine (VISTARIL) 25 MG capsule, Take 1 capsule (25 mg total) by mouth 3  (three) times daily as needed for anxiety., Disp: 90 capsule, Rfl: 3   Multiple Vitamin (MULTIVITAMIN) tablet, Take 1 tablet by mouth daily., Disp: , Rfl:    pantoprazole (PROTONIX) 40 MG tablet, Take 1 tablet (40 mg total) by mouth daily., Disp: 90 tablet, Rfl: 3   tirzepatide (MOUNJARO) 2.5 MG/0.5ML Pen, Inject 2.5 mg into the skin once a week., Disp: , Rfl:    tirzepatide (MOUNJARO) 5 MG/0.5ML Pen, Inject 5 mg into the skin once a week., Disp: 2 mL, Rfl: 2   Zinc 50 MG CAPS, Take 1 capsule by mouth daily., Disp: , Rfl:   No Known Allergies  Objective:   BP 105/69   Pulse 78   Temp 97.9 F (36.6 C) (Temporal)   Ht 5\' 3"  (1.6 m)   Wt 185 lb 9.6 oz (84.2 kg)   BMI 32.88 kg/m    Physical Exam Vitals reviewed.  Constitutional:      General: She is not in acute distress.    Appearance: Normal appearance. She is not ill-appearing, toxic-appearing or diaphoretic.  HENT:     Head: Normocephalic and atraumatic.  Eyes:     General: No scleral icterus.       Right eye: No discharge.        Left eye: No discharge.     Conjunctiva/sclera: Conjunctivae normal.  Cardiovascular:     Rate and Rhythm: Normal rate.  Pulmonary:  Effort: Pulmonary effort is normal. No respiratory distress.  Musculoskeletal:        General: Normal range of motion.     Cervical back: Normal range of motion.  Skin:    General: Skin is warm and dry.     Capillary Refill: Capillary refill takes less than 2 seconds.     Findings: Abscess (left axilla - mostly under skin, not very raised, does have erythema and tenderness) present.  Neurological:     General: No focal deficit present.     Mental Status: She is alert and oriented to person, place, and time. Mental status is at baseline.  Psychiatric:        Mood and Affect: Mood normal.        Behavior: Behavior normal.        Thought Content: Thought content normal.        Judgment: Judgment normal.

## 2021-07-24 ENCOUNTER — Other Ambulatory Visit: Payer: Self-pay

## 2021-07-24 ENCOUNTER — Ambulatory Visit (INDEPENDENT_AMBULATORY_CARE_PROVIDER_SITE_OTHER): Payer: 59 | Admitting: Pharmacist

## 2021-07-24 ENCOUNTER — Encounter: Payer: Self-pay | Admitting: Pharmacist

## 2021-07-24 VITALS — Wt 174.4 lb

## 2021-07-24 DIAGNOSIS — E669 Obesity, unspecified: Secondary | ICD-10-CM

## 2021-07-24 MED ORDER — TIRZEPATIDE 7.5 MG/0.5ML ~~LOC~~ SOAJ: 7.5000 mg | SUBCUTANEOUS | 2 refills | Status: DC

## 2021-07-24 NOTE — Progress Notes (Signed)
    07/24/2021 Name: Teresa Woodard  MRN: [id number]      DOB: March 10, 1971    S:  50 yoF Presents for weight loss evaluation, education, and management.  She would like to lose weight and is pleased with her progress on Mounjaro.  She is now on the 5mg  (week 2) and tolerating well.  She notes feeling more full and craving less.  She is taking in less calories overall.She currently weighs 174lbs (~20 lb weight loss).  Current BMI is 30.     Insurance coverage/medication affordability: [employer] commercial   Patient reports adherence with medications. Current medications for weight loss: mounjaro   Patient is active during the day.  She continues to be motivated to lose weight.   Discussed meal planning options and Plate method for healthy eating EAT SMALLER MEALS STOP EATING WHEN YOU FEEL FULL AVOID High FAT, High sugar FOODS INCREASE YOUR WATER INTAKE DECREASE INTAKE OF CARBONATED BEVERAGES TO AVOID GI SIDE EFFECTS   Goal weight is around 150-160lbs per patient report     A/P:   -Healthy eating and meal planning discussed   -Increase water and exercise   -INCREASE MOUNJARO to 7.5MG  sq weekly in 2 weeks after completion of 5mg  pack (4 week trial) Counseled on contraception to switch to a non-oral contraceptive method, or add a barrier method of contraception for 4 weeks after initiation and for 4 weeks after each dose escalation Denies personal and family history of Medullary thyroid cancer (MTC)             Work to eat low fat, smaller meals to reduce side effects             Decreased carbonated beverages   Written patient instructions provided.  Total time in face to face counseling 25 minutes.   , PharmD, BCPS Clinical Pharmacist, [facility]  II Phone [phone]

## 2021-10-01 ENCOUNTER — Telehealth: Payer: Self-pay | Admitting: Family Medicine

## 2021-10-01 NOTE — Telephone Encounter (Signed)
Pt is aware Raynelle Fanning is out of the office today and message will be routed to [place] [place] to address when she returns. Pt just wants to speak with Raynelle Fanning about increasing her dose of Mounjaro.

## 2021-10-03 MED ORDER — TIRZEPATIDE 10 MG/0.5ML ~~LOC~~ SOAJ: 10.0000 mg | SUBCUTANEOUS | 2 refills | Status: DC

## 2021-10-03 NOTE — Telephone Encounter (Signed)
-  INCREASE MOUNJARO to 10MG  sq weekly in 2 weeks after completion of 5mg  pack (4  Counseled on contraception to switch to a non-oral contraceptive method, or add a barrier method of contraception for 4 weeks after initiation and for 4 weeks after each dose escalation Denies personal and family history of Medullary thyroid cancer (MTC)             Work to eat low fat, smaller meals to reduce side effects             Decreased carbonated beverages

## 2021-11-29 ENCOUNTER — Telehealth: Payer: Self-pay | Admitting: Family Medicine

## 2021-11-29 DIAGNOSIS — E669 Obesity, unspecified: Secondary | ICD-10-CM

## 2021-11-29 NOTE — Telephone Encounter (Signed)
Pt has been using Mounjaro since July.  Feel nausea every once in a while.  Has lost about 50Lbs.  Does she need to continue [facility]?  Pt is currently happy with weight but is currently at a stand still.  If ok to stay on would like Rx for nausea.

## 2021-11-30 MED ORDER — ONDANSETRON HCL 4 MG PO TABS: 4.0000 mg | ORAL_TABLET | Freq: Three times a day (TID) | ORAL | 0 refills | Status: DC | PRN

## 2021-11-30 MED ORDER — TIRZEPATIDE 10 MG/0.5ML ~~LOC~~ SOAJ: 10.0000 mg | SUBCUTANEOUS | 2 refills | Status: DC

## 2021-11-30 NOTE — Telephone Encounter (Signed)
143 lbs  Will continue on mounjaro 10mg  for now---extend out to every 10-14 days to wean off Patient needs zofran PRN nausea

## 2021-12-03 ENCOUNTER — Encounter: Payer: Self-pay | Admitting: Family Medicine

## 2021-12-06 ENCOUNTER — Telehealth: Payer: Self-pay | Admitting: Family Medicine

## 2021-12-06 NOTE — Telephone Encounter (Signed)
Patient aware and verbalizes understanding. 

## 2021-12-06 NOTE — Telephone Encounter (Signed)
-  Fine to stop cold [place] -It will remain in her system for up to 5 weeks Please remind patient to work hard to maintain a healthy diet and lifestyle to keep weight off!

## 2021-12-06 NOTE — Telephone Encounter (Signed)
Patient states her insurance is no longer covering mounjaro since she does not have DM and states it will be $800.  She is fine with the weight she is at now but wants to make sure it is okay to stop med cold [place]? Last injection was x 1 week ago and was doing it every 14 days.

## 2021-12-21 DIAGNOSIS — R350 Frequency of micturition: Secondary | ICD-10-CM | POA: Diagnosis not present

## 2021-12-31 ENCOUNTER — Telehealth: Payer: Self-pay | Admitting: Family Medicine

## 2021-12-31 ENCOUNTER — Other Ambulatory Visit: Payer: Self-pay | Admitting: Family Medicine

## 2021-12-31 DIAGNOSIS — F411 Generalized anxiety disorder: Secondary | ICD-10-CM

## 2021-12-31 DIAGNOSIS — K219 Gastro-esophageal reflux disease without esophagitis: Secondary | ICD-10-CM

## 2021-12-31 NOTE — Telephone Encounter (Signed)
Key: [id number] - Rx #: [id number] Need help? Call us at [phone] Status Sent to plan today Drug Pantoprazole Sodium 40MG  dr tablets

## 2021-12-31 NOTE — Telephone Encounter (Signed)
06/06/21 OV RTC 1 yr

## 2022-01-02 MED ORDER — OMEPRAZOLE 40 MG PO CPDR: 40.0000 mg | DELAYED_RELEASE_CAPSULE | Freq: Every day | ORAL | 1 refills | Status: DC

## 2022-01-02 NOTE — Telephone Encounter (Signed)
Per DPR, patient notified via voicemail. 

## 2022-01-02 NOTE — Telephone Encounter (Signed)
Prior Auth for Protonix DENIED  No reason given

## 2022-01-02 NOTE — Telephone Encounter (Signed)
Please let patient know the PA for her Protonix was denied. I have sent omeprazole for her to take instead.

## 2022-01-04 DIAGNOSIS — M65311 Trigger thumb, right thumb: Secondary | ICD-10-CM | POA: Diagnosis not present

## 2022-01-04 DIAGNOSIS — M79641 Pain in right hand: Secondary | ICD-10-CM | POA: Diagnosis not present

## 2022-01-23 IMAGING — US US BREAST*L* LIMITED INC AXILLA
1 series · 4 of 4 positions shown · non-contrast
Comparison: Previous exam(s).

CLINICAL DATA: 49-year-old female with a palpable left breast lump
for approximately 1 and half months.

EXAM:
DIGITAL DIAGNOSTIC LEFT MAMMOGRAM WITH CAD AND TOMO
ULTRASOUND LEFT BREAST

[Series 1: us breast*left* limited inc axilla · 0.06mm/px · 4 of 4 slices shown]
[im 1/4]
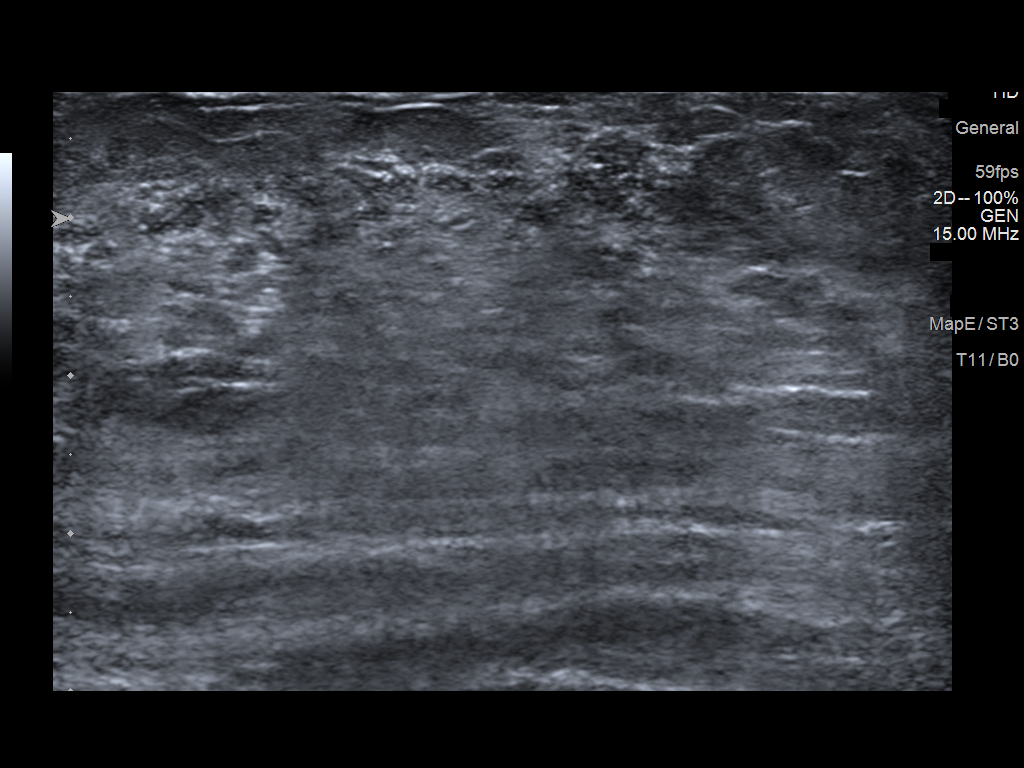
[im 2/4]
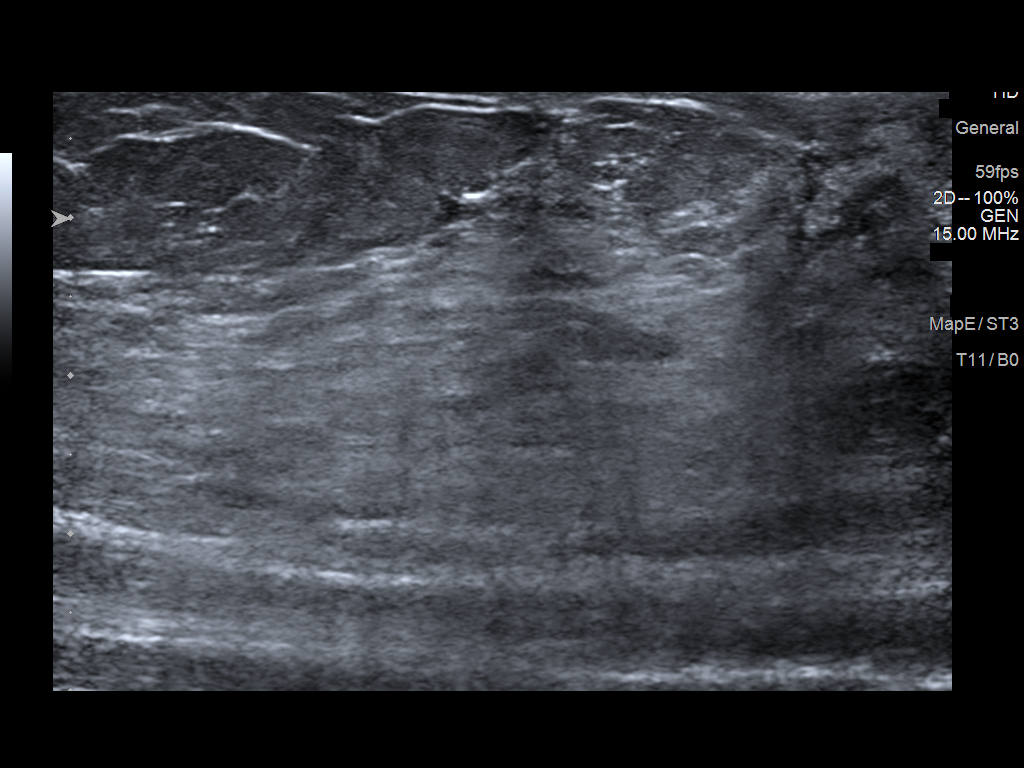
[im 3/4]
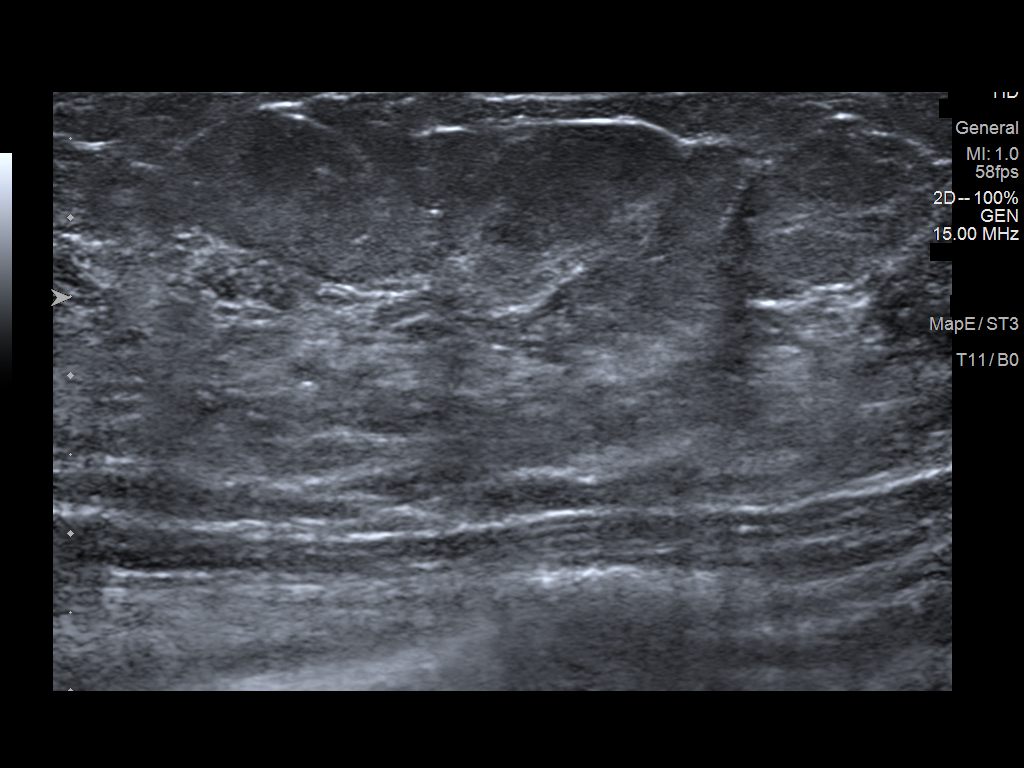
[im 4/4]
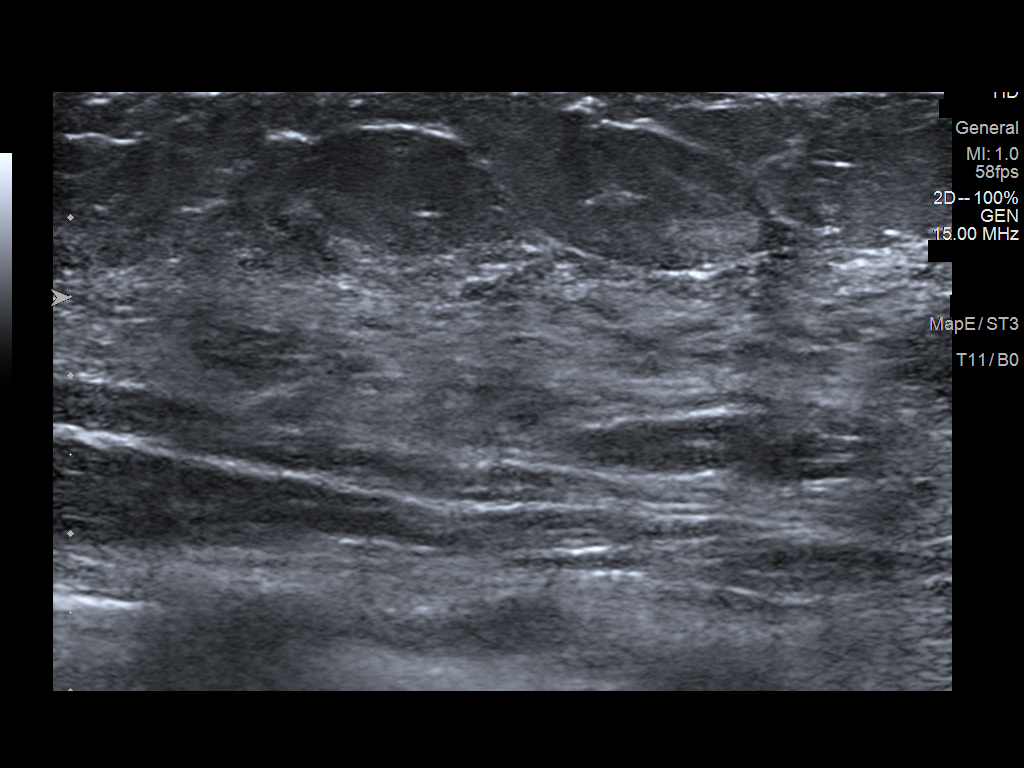

[4 of 4 positions shown; findings below may reference images not displayed]

ACR Breast Density Category c: The breast tissue is heterogeneously
dense, which may obscure small masses.
FINDINGS: A radiopaque BB was placed at the site of the patient's palpable
lump in the periareolar left breast. No focal or suspicious findings
are seen deep to the radiopaque BB. No suspicious mammographic
findings are identified in the remainder of the left breast.

Mammographic images were processed with CAD.

On physical exam, I palpate heterogenous fibroglandular tissue but
no suspicious lumps in the periareolar left breast.

Targeted ultrasound is performed, showing normal fibroglandular
tissue without focal or suspicious sonographic abnormality.
IMPRESSION: No suspicious mammographic or sonographic findings corresponding
with the patient's palpable left breast lump.

RECOMMENDATION:
1. Clinical follow-up recommended for the palpable area of concern
in the left breast. Any further workup should be based on clinical
grounds.
2. Annual bilateral screening mammography due in April 2021.

I have discussed the findings and recommendations with the patient.
If applicable, a reminder letter will be sent to the patient
regarding the next appointment.

BI-RADS CATEGORY  1: Negative.

## 2022-01-23 IMAGING — MG MM DIGITAL DIAGNOSTIC UNILAT*L* W/ TOMO W/ CAD
6 series · 6 of 18 positions shown · non-contrast
Comparison: Previous exam(s).

CLINICAL DATA: 49-year-old female with a palpable left breast lump
for approximately 1 and half months.

EXAM:
DIGITAL DIAGNOSTIC LEFT MAMMOGRAM WITH CAD AND TOMO
ULTRASOUND LEFT BREAST

[L MLO synth-2D (1 of 2)]
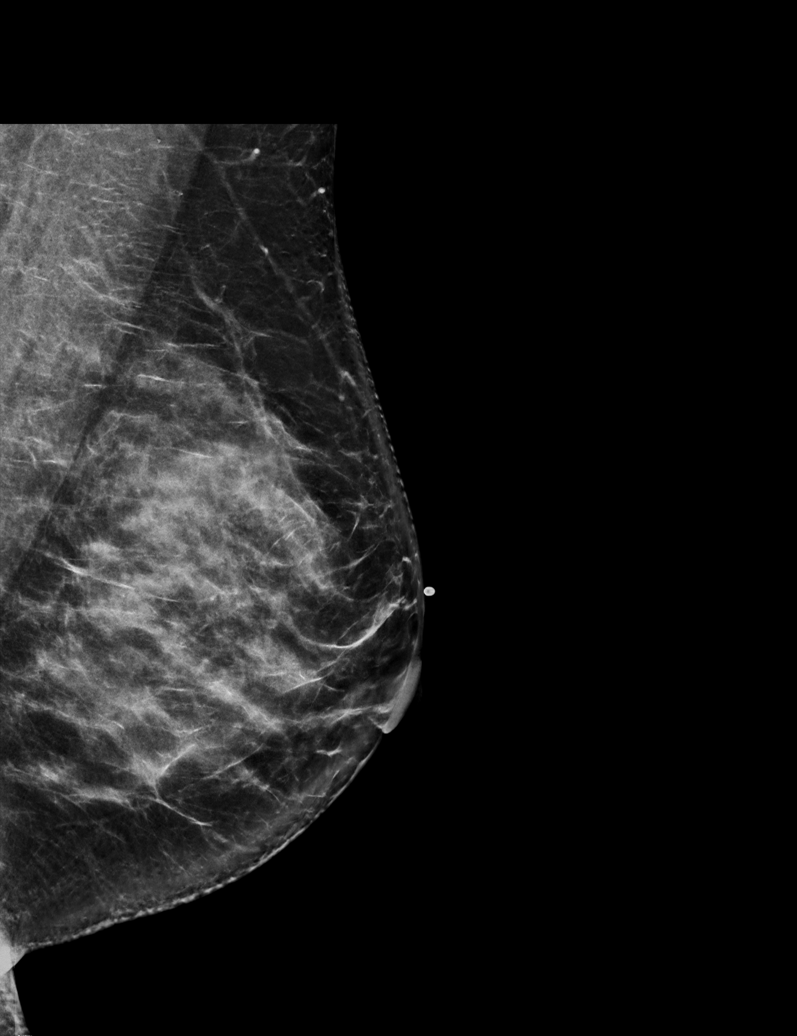

[L MLO synth-2D (2 of 2)]
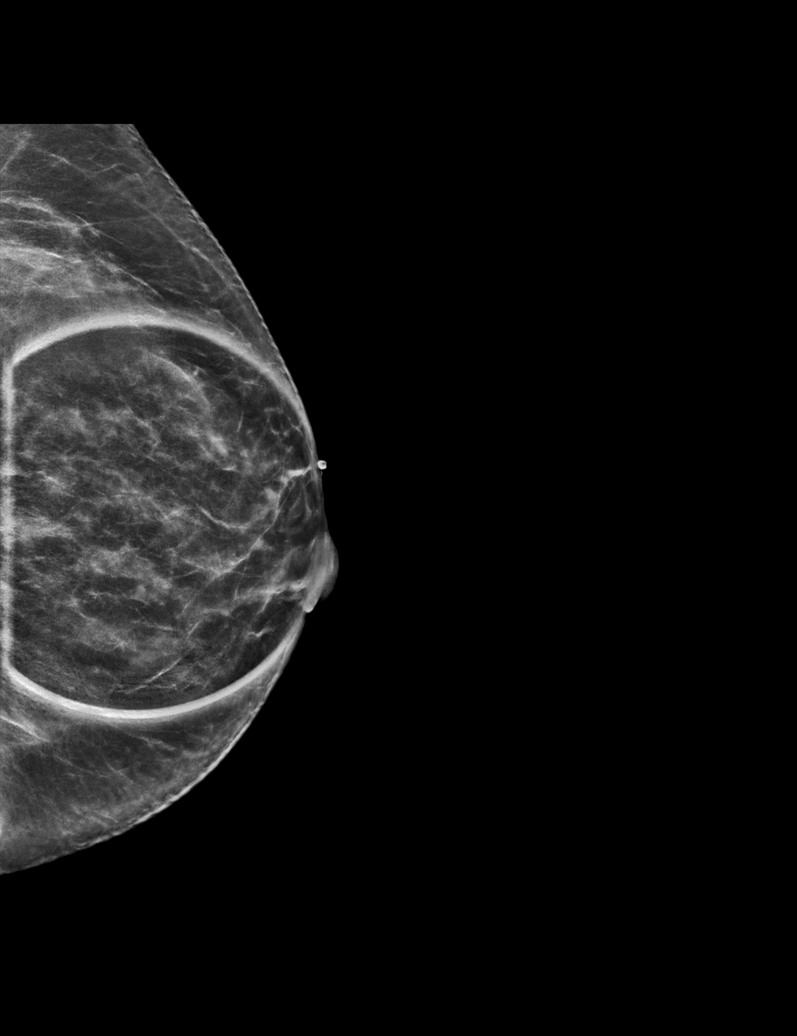

[L CC synth-2D]
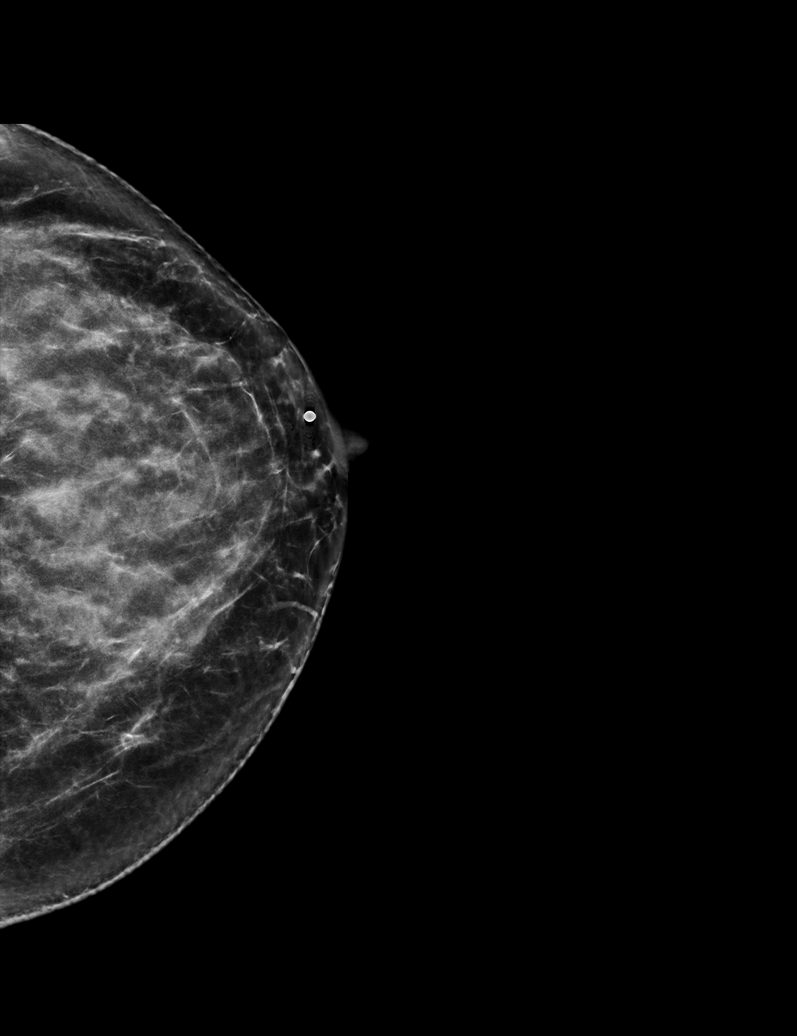

[L MLO tomo (1 of 2) · tomo slice 40/79.0]
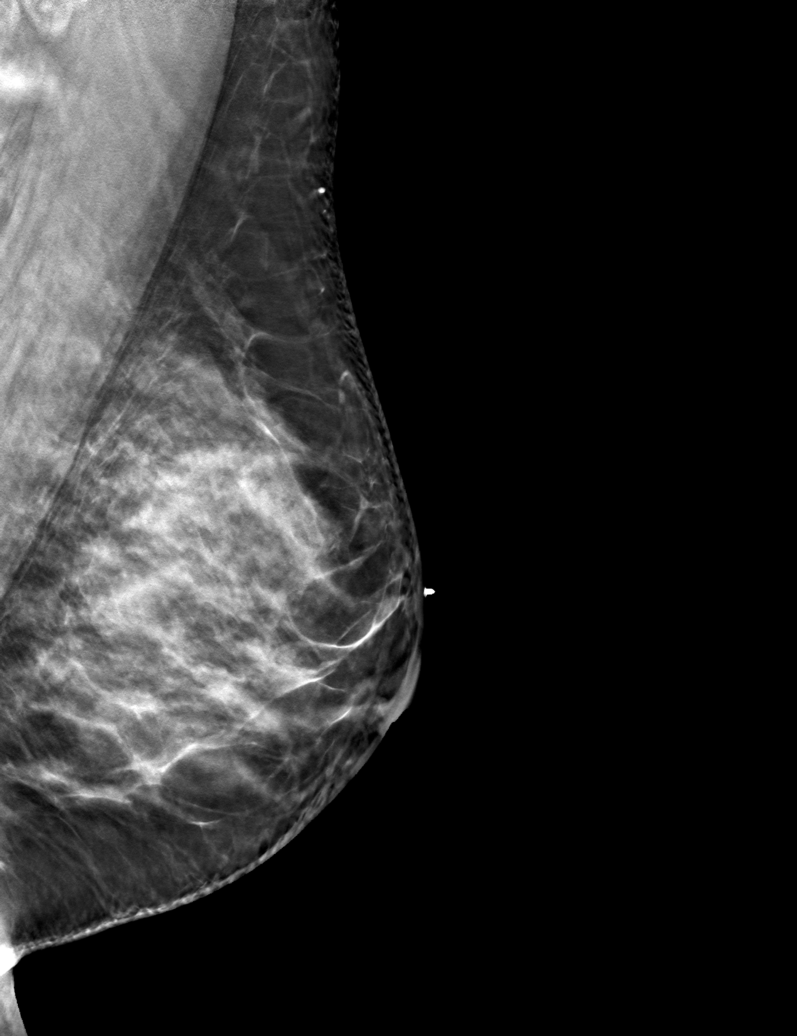

[L CC tomo · tomo slice 35/68.0]
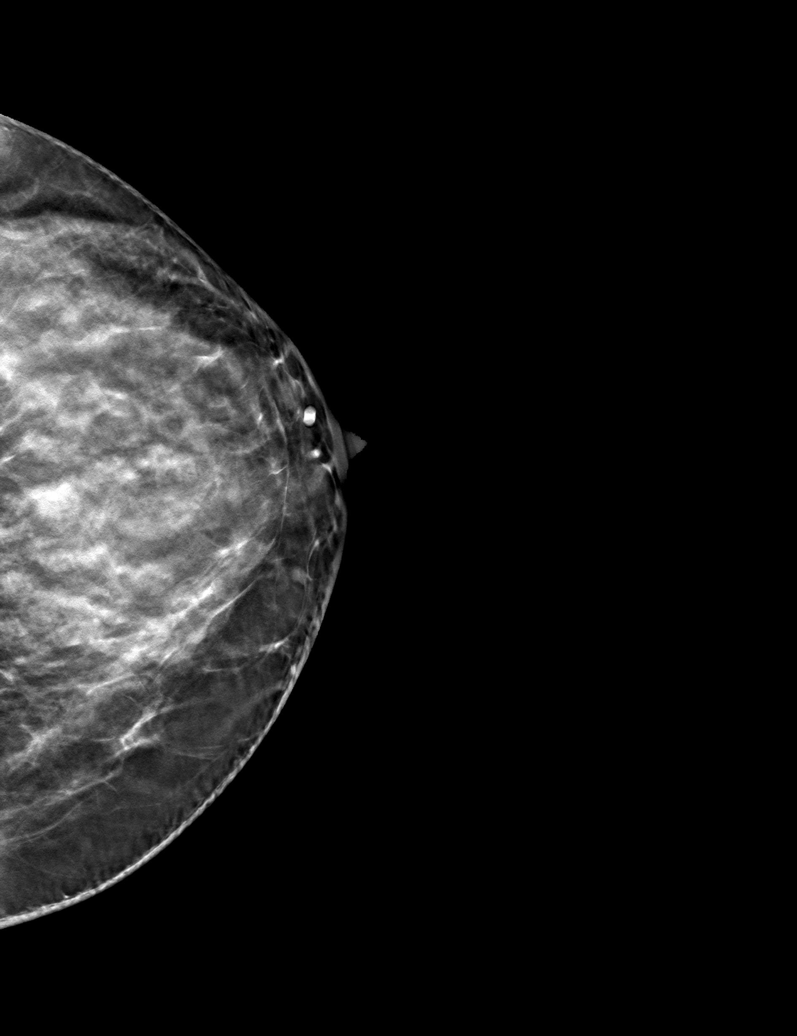

[L MLO tomo (2 of 2) · tomo slice 32/63.0]
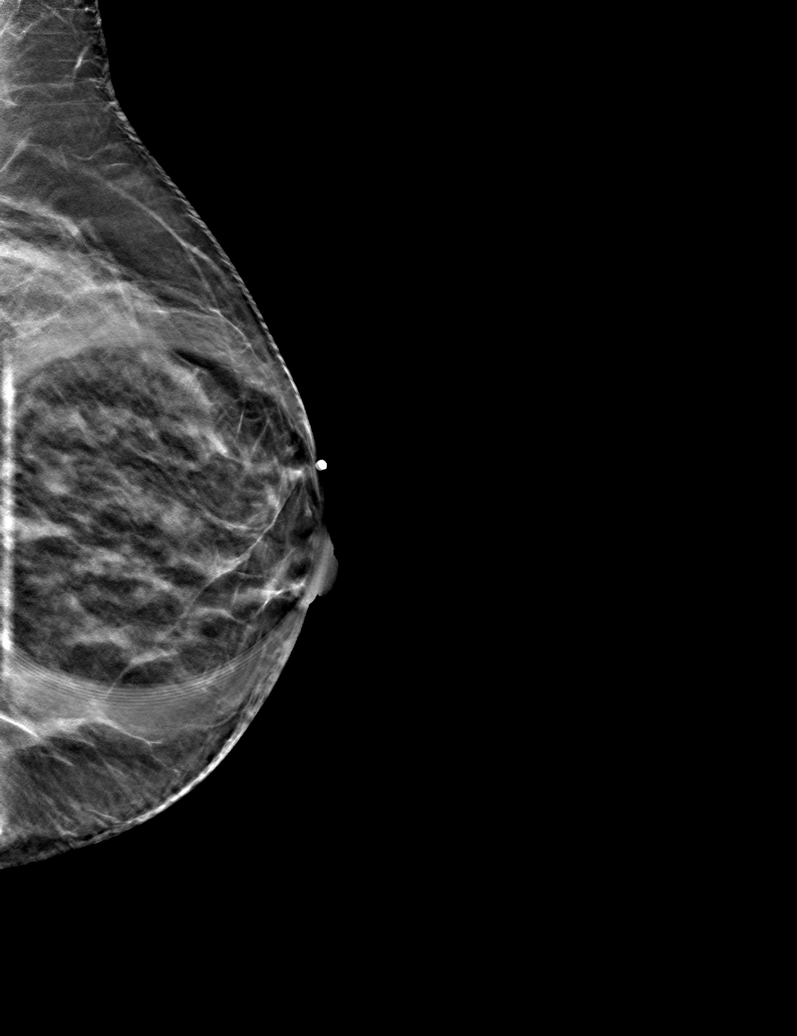

[6 of 18 positions shown; findings below may reference images not displayed]

ACR Breast Density Category c: The breast tissue is heterogeneously
dense, which may obscure small masses.
FINDINGS: A radiopaque BB was placed at the site of the patient's palpable
lump in the periareolar left breast. No focal or suspicious findings
are seen deep to the radiopaque BB. No suspicious mammographic
findings are identified in the remainder of the left breast.

Mammographic images were processed with CAD.

On physical exam, I palpate heterogenous fibroglandular tissue but
no suspicious lumps in the periareolar left breast.

Targeted ultrasound is performed, showing normal fibroglandular
tissue without focal or suspicious sonographic abnormality.
IMPRESSION: No suspicious mammographic or sonographic findings corresponding
with the patient's palpable left breast lump.

RECOMMENDATION:
1. Clinical follow-up recommended for the palpable area of concern
in the left breast. Any further workup should be based on clinical
grounds.
2. Annual bilateral screening mammography due in April 2021.

I have discussed the findings and recommendations with the patient.
If applicable, a reminder letter will be sent to the patient
regarding the next appointment.

BI-RADS CATEGORY  1: Negative.

## 2022-02-05 ENCOUNTER — Telehealth: Payer: Self-pay | Admitting: Family Medicine

## 2022-02-05 NOTE — Telephone Encounter (Unsigned)
Patient wants to talk to Raynelle Fanning about mounjaro. [place] Spaniel that she has spoke with her about getting on a different medicine. Please call back.  ?

## 2022-02-05 NOTE — Telephone Encounter (Signed)
Patient states that her insurance no longer covers mounjaro.  Patient states she has previously talked to you about doing something else ?  ?

## 2022-02-06 NOTE — Telephone Encounter (Signed)
Patient aware and verbalizes understanding. 

## 2022-02-06 NOTE — Telephone Encounter (Signed)
Please have patient call her insurance to see what is covered for weight loss ?It doesn't look like her insurance will cover wegovy/saxenda ?She can make an appt with me once she knows coverage ? ?

## 2022-04-03 ENCOUNTER — Other Ambulatory Visit: Payer: Self-pay | Admitting: Family Medicine

## 2022-04-03 DIAGNOSIS — K219 Gastro-esophageal reflux disease without esophagitis: Secondary | ICD-10-CM

## 2022-06-21 DIAGNOSIS — Z1231 Encounter for screening mammogram for malignant neoplasm of breast: Secondary | ICD-10-CM | POA: Diagnosis not present

## 2022-06-21 DIAGNOSIS — Z01419 Encounter for gynecological examination (general) (routine) without abnormal findings: Secondary | ICD-10-CM | POA: Diagnosis not present

## 2022-06-21 DIAGNOSIS — Z6830 Body mass index (BMI) 30.0-30.9, adult: Secondary | ICD-10-CM | POA: Diagnosis not present

## 2022-07-12 DIAGNOSIS — M722 Plantar fascial fibromatosis: Secondary | ICD-10-CM | POA: Diagnosis not present

## 2022-07-12 DIAGNOSIS — M19071 Primary osteoarthritis, right ankle and foot: Secondary | ICD-10-CM | POA: Diagnosis not present

## 2022-07-12 DIAGNOSIS — M792 Neuralgia and neuritis, unspecified: Secondary | ICD-10-CM | POA: Diagnosis not present

## 2022-08-06 DIAGNOSIS — M79641 Pain in right hand: Secondary | ICD-10-CM | POA: Diagnosis not present

## 2022-08-08 ENCOUNTER — Encounter: Payer: Self-pay | Admitting: Family Medicine

## 2022-08-08 ENCOUNTER — Ambulatory Visit (INDEPENDENT_AMBULATORY_CARE_PROVIDER_SITE_OTHER): Payer: BC Managed Care – PPO | Admitting: Family Medicine

## 2022-08-08 VITALS — BP 100/64 | HR 74 | Temp 97.6°F | Resp 20 | Ht 63.0 in | Wt 176.0 lb

## 2022-08-08 DIAGNOSIS — F411 Generalized anxiety disorder: Secondary | ICD-10-CM | POA: Diagnosis not present

## 2022-08-08 DIAGNOSIS — E669 Obesity, unspecified: Secondary | ICD-10-CM

## 2022-08-08 DIAGNOSIS — J4520 Mild intermittent asthma, uncomplicated: Secondary | ICD-10-CM

## 2022-08-08 DIAGNOSIS — Z0001 Encounter for general adult medical examination with abnormal findings: Secondary | ICD-10-CM

## 2022-08-08 DIAGNOSIS — M2550 Pain in unspecified joint: Secondary | ICD-10-CM | POA: Diagnosis not present

## 2022-08-08 DIAGNOSIS — E559 Vitamin D deficiency, unspecified: Secondary | ICD-10-CM

## 2022-08-08 DIAGNOSIS — K219 Gastro-esophageal reflux disease without esophagitis: Secondary | ICD-10-CM | POA: Diagnosis not present

## 2022-08-08 DIAGNOSIS — Z0000 Encounter for general adult medical examination without abnormal findings: Secondary | ICD-10-CM

## 2022-08-08 DIAGNOSIS — J302 Other seasonal allergic rhinitis: Secondary | ICD-10-CM

## 2022-08-08 DIAGNOSIS — E66811 Obesity, class 1: Secondary | ICD-10-CM

## 2022-08-08 DIAGNOSIS — L255 Unspecified contact dermatitis due to plants, except food: Secondary | ICD-10-CM

## 2022-08-08 MED ORDER — FLUOXETINE HCL 20 MG PO CAPS: 20.0000 mg | ORAL_CAPSULE | Freq: Every day | ORAL | 2 refills | Status: DC

## 2022-08-08 MED ORDER — OMEPRAZOLE 40 MG PO CPDR: 40.0000 mg | DELAYED_RELEASE_CAPSULE | Freq: Every day | ORAL | 1 refills | Status: DC

## 2022-08-08 MED ORDER — ALBUTEROL SULFATE HFA 108 (90 BASE) MCG/ACT IN AERS: 2.0000 | INHALATION_SPRAY | Freq: Four times a day (QID) | RESPIRATORY_TRACT | 3 refills | Status: DC | PRN

## 2022-08-08 MED ORDER — FLUTICASONE PROPIONATE 50 MCG/ACT NA SUSP: 2.0000 | Freq: Every day | NASAL | 5 refills | Status: DC

## 2022-08-08 MED ORDER — HYDROXYZINE PAMOATE 25 MG PO CAPS: 25.0000 mg | ORAL_CAPSULE | Freq: Three times a day (TID) | ORAL | 4 refills | Status: DC | PRN

## 2022-08-08 NOTE — Patient Instructions (Signed)
Here is a guide to help us find out which weight loss medications will be covered by your insurance plan.  Please check out this web site  [email or web address] and follow the 3 simple steps.   There is also a phone number you can call if you do not have access to the Internet. [phone] (Monday- Friday 8am-8pm)  [employer] provides coverage information for more than 80% of the inquiries submitted!!  

## 2022-08-08 NOTE — Progress Notes (Signed)
Assessment & Plan:  Well adult exam Discussed health benefits of physical activity, and encouraged her to engage in regular exercise appropriate for her age and condition. Preventive health education provided. Declined Shingrix and hepatitis C screening. Mammogram result requested from [facility].  Immunization History  Administered Date(s) Administered   Influenza Whole 08/20/2012   Influenza,inj,Quad PF,6+ Mos 08/27/2019   Influenza-Unspecified 10/03/2019   Moderna Sars-Covid-2 Vaccination 01/25/2020, 02/25/2020   Pneumococcal Polysaccharide-23 06/06/2021   Tdap 08/20/2012   Health Maintenance  Topic Date Due   COVID-19 Vaccine (3 - Moderna series) 08/23/2022 (Originally 04/21/2020)   Zoster Vaccines- Shingrix (1 of 2) 10/30/2022 (Originally 03/03/2020)   INFLUENZA VACCINE  03/02/2023 (Originally 07/02/2022)   Hepatitis C Screening  08/09/2023 (Originally 03/03/1989)   TETANUS/TDAP  08/20/2022   PAP SMEAR-Modifier  04/08/2023   MAMMOGRAM  06/23/2023   COLONOSCOPY (Pts 45-92yrs Insurance coverage will need to be confirmed)  05/12/2025   HIV Screening  Completed   HPV VACCINES  Aged Out   - CBC with Differential/Platelet - CMP14+EGFR - Lipid panel  2. Mild intermittent asthma without complication Well controlled on current regimen.  - fluticasone-salmeterol (ADVAIR) 100-50 MCG/ACT AEPB; Inhale 1 puff into the lungs daily. - albuterol (VENTOLIN HFA) 108 (90 Base) MCG/ACT inhaler; Inhale 2 puffs into the lungs every 6 (six) hours as needed for wheezing or shortness of breath.  Dispense: 18 g; Refill: 3  3. GERD without esophagitis Well controlled on current regimen.  - omeprazole (PRILOSEC) 40 MG capsule; Take 1 capsule (40 mg total) by mouth daily.  Dispense: 90 capsule; Refill: 1  4. Vitamin D deficiency Well controlled on current regimen.  - VITAMIN D 25 Hydroxy (Vit-D Deficiency, Fractures)  5. GAD (generalized anxiety disorder) Uncontrolled.  Starting Prozac daily. Continue hydroxyzine as needed. - gabapentin (NEURONTIN) 100 MG capsule; Take 100 mg by mouth at bedtime. - CMP14+EGFR - hydrOXYzine (VISTARIL) 25 MG capsule; Take 1 capsule (25 mg total) by mouth every 8 (eight) hours as needed.  Dispense: 90 capsule; Refill: 4 - FLUoxetine (PROZAC) 20 MG capsule; Take 1 capsule (20 mg total) by mouth daily.  Dispense: 30 capsule; Refill: 2  6. Obesity (BMI 30.0-34.9) Encouraged healthy eating and exercise. Patient to visit [email or web address] to see what weight loss medication her insurance will cover and send me a MyChart message.  - CBC with Differential/Platelet - CMP14+EGFR - Lipid panel  7. Seasonal allergies Well controlled on current regimen.  - fluticasone (FLONASE) 50 MCG/ACT nasal spray; Place 2 sprays into both nostrils daily.  Dispense: 16 g; Refill: 5  8. Polyarthralgia - meloxicam (MOBIC) 15 MG tablet; Take 15 mg by mouth daily. - gabapentin (NEURONTIN) 100 MG capsule; Take 100 mg by mouth at bedtime. - Rheumatoid Arthritis Profile  9. Rhus dermatitis Continue Calamine lotion. Recommended Tecnu wash in the future when she knows she got into poison oak or ivy.   Follow-up: Return in about 6 weeks (around 09/19/2022) for Anxiety with Je.   Deliah Boston, MSN, APRN, FNP-C [facility]  Subjective:  Patient ID: Teresa Woodard, female    DOB: [birth year]  Age: 51 y.o. MRN: [id number]  Patient Care Team: Gwenlyn Fudge, FNP as PCP - General (Family Medicine)   CC:  Chief Complaint  Patient presents with   Annual Exam    No pap     HPI Teresa Woodard is a 51 y.o. female who presents today for a complete physical exam. She reports consuming  a general diet. 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 She generally feels well. She reports sleeping fairly well; not as well due to stress. She does not have additional problems to discuss today.   Vision:Within last  year Dental:No regular dental care  DEPRESSION SCREENING    08/08/2022   10:07 AM 07/04/2021    9:47 AM 06/06/2021    9:36 AM 09/29/2019   12:16 PM 08/27/2019   10:08 AM 10/15/2018   11:39 AM 06/26/2018    1:41 PM  PHQ 2/9 Scores  PHQ - 2 Score 0 0 0 0 0 0 0  PHQ- 9 Score $Remov'1  2 2       'wMHgvH$ Asthma: only requires Albuterol when she is sick.   GERD: was doing well with omeprazole, but ran out of medication.  Vitamin D Deficiency: taking a daily supplement. Last vitamin D level normal in July 2022.  Anxiety: takes hydroxyzine as needed.     08/08/2022   10:07 AM 06/06/2021    9:36 AM 03/24/2020    8:11 AM 09/29/2019   12:16 PM  GAD 7 : Generalized Anxiety Score  Nervous, Anxious, on Edge $Remov'1 1 1 1  'lxuiGM$ Control/stop worrying 2 0 0 1  Worry too much - different things 2 0 0 1  Trouble relaxing 2 0 1 1  Restless 2 0 0 0  Easily annoyed or irritable 0 0 0 1  Afraid - awful might happen 0 0 0 0  Total GAD 7 Score $Remov'9 1 2 5  'KkucZE$ Anxiety Difficulty Somewhat difficult Not difficult at all Not difficult at all     Review of Systems  Constitutional:  Negative for chills, fever, malaise/fatigue and weight loss.  HENT:  Negative for congestion, ear discharge, ear pain, nosebleeds, sinus pain, sore throat and tinnitus.   Eyes:  Negative for blurred vision, double vision, pain, discharge and redness.  Respiratory:  Negative for cough, shortness of breath and wheezing.   Cardiovascular:  Negative for chest pain, palpitations and leg swelling.  Gastrointestinal:  Negative for abdominal pain, constipation, diarrhea, heartburn, nausea and vomiting.  Genitourinary:  Negative for dysuria, frequency and urgency.  Musculoskeletal:  Positive for joint pain. Negative for myalgias.  Skin:  Negative for rash.  Neurological:  Negative for dizziness, seizures, weakness and headaches.  Psychiatric/Behavioral:  Negative for depression, substance abuse and suicidal ideas. The patient is nervous/anxious.      Current  Outpatient Medications:    albuterol (VENTOLIN HFA) 108 (90 Base) MCG/ACT inhaler, Inhale 2 puffs into the lungs every 6 (six) hours as needed for wheezing or shortness of breath., Disp: 18 g, Rfl: 3   Ascorbic Acid (VITAMIN C WITH ROSE HIPS) 1000 MG tablet, Take 1,000 mg by mouth daily., Disp: , Rfl:    cholecalciferol (VITAMIN D) 1000 units tablet, Take 1,000 Units by mouth daily., Disp: , Rfl:    fluticasone-salmeterol (ADVAIR) 100-50 MCG/ACT AEPB, Inhale 1 puff into the lungs daily., Disp: , Rfl:    gabapentin (NEURONTIN) 100 MG capsule, Take 100 mg by mouth at bedtime., Disp: , Rfl:    hydrOXYzine (VISTARIL) 25 MG capsule, TAKE ONE CAPSULE BY MOUTH THREE TIMES DAILY AS NEEDED FOR ANXIETY, Disp: 90 capsule, Rfl: 4   meloxicam (MOBIC) 15 MG tablet, Take 15 mg by mouth daily., Disp: , Rfl:    Multiple Vitamin (MULTIVITAMIN) tablet, Take 1 tablet by mouth daily., Disp: , Rfl:    Zinc 50 MG CAPS, Take 1 capsule by mouth daily., Disp: ,  Rfl:    fluticasone (FLONASE) 50 MCG/ACT nasal spray, Place 2 sprays into both nostrils daily. (Patient not taking: Reported on 08/08/2022), Disp: , Rfl:    omeprazole (PRILOSEC) 40 MG capsule, Take 1 capsule (40 mg total) by mouth daily. (Patient not taking: Reported on 08/08/2022), Disp: 90 capsule, Rfl: 1   ondansetron (ZOFRAN) 4 MG tablet, Take 1 tablet (4 mg total) by mouth every 8 (eight) hours as needed for nausea or vomiting. (Patient not taking: Reported on 08/08/2022), Disp: 20 tablet, Rfl: 0  No Known Allergies  Past Medical History:  Diagnosis Date   Allergy    Asthma     Past Surgical History:  Procedure Laterality Date   TUBAL LIGATION      Family History  Problem Relation Age of Onset   Arthritis/Rheumatoid Mother    Cancer Mother        breast, uterine,lung   Breast cancer Mother 56   Breast cancer Paternal Aunt    Colitis Neg Hx    Esophageal cancer Neg Hx    Stomach cancer Neg Hx    Rectal cancer Neg Hx     Social History    Socioeconomic History   Marital status: Married    Spouse name: Not on file   Number of children: Not on file   Years of education: Not on file   Highest education level: Not on file  Occupational History   Not on file  Tobacco Use   Smoking status: Never   Smokeless tobacco: Never  Vaping Use   Vaping Use: Never used  Substance and Sexual Activity   Alcohol use: No   Drug use: No   Sexual activity: Not on file  Other Topics Concern   Not on file  Social History Narrative   Not on file   Social Determinants of Health   Financial Resource Strain: Not on file  Food Insecurity: Not on file  Transportation Needs: Not on file  Physical Activity: Not on file  Stress: Not on file  Social Connections: Not on file  Intimate Partner Violence: Not on file      Objective:    BP 100/64   Pulse 74   Temp 97.6 F (36.4 C)   Resp 20   Ht $R'5\' 3"'dG$  (1.6 m)   Wt 176 lb (79.8 kg)   LMP 01/02/2022   SpO2 97%   BMI 31.18 kg/m   BP Readings from Last 3 Encounters:  08/08/22 100/64  07/04/21 105/69  06/06/21 123/83   Wt Readings from Last 3 Encounters:  08/08/22 176 lb (79.8 kg)  11/30/21 143 lb (64.9 kg)  07/24/21 174 lb 6.4 oz (79.1 kg)    Physical Exam Vitals reviewed.  Constitutional:      General: She is not in acute distress.    Appearance: Normal appearance. She is obese. She is not ill-appearing, toxic-appearing or diaphoretic.  HENT:     Head: Normocephalic and atraumatic.     Right Ear: Tympanic membrane, ear canal and external ear normal. There is no impacted cerumen.     Left Ear: Tympanic membrane, ear canal and external ear normal. There is no impacted cerumen.     Nose: Nose normal. No congestion or rhinorrhea.     Mouth/Throat:     Mouth: Mucous membranes are moist.     Pharynx: Oropharynx is clear. No oropharyngeal exudate or posterior oropharyngeal erythema.  Eyes:     General: No scleral icterus.  Right eye: No discharge.        Left eye: No  discharge.     Conjunctiva/sclera: Conjunctivae normal.     Pupils: Pupils are equal, round, and reactive to light.  Cardiovascular:     Rate and Rhythm: Normal rate and regular rhythm.     Heart sounds: Normal heart sounds. No murmur heard.    No friction rub. No gallop.  Pulmonary:     Effort: Pulmonary effort is normal. No respiratory distress.     Breath sounds: Normal breath sounds. No stridor. No wheezing, rhonchi or rales.  Abdominal:     General: Abdomen is flat. Bowel sounds are normal. There is no distension.     Palpations: Abdomen is soft. There is no hepatomegaly, splenomegaly or mass.     Tenderness: There is no abdominal tenderness. There is no guarding or rebound.     Hernia: No hernia is present.  Musculoskeletal:        General: Normal range of motion.     Cervical back: Normal range of motion and neck supple. No rigidity. No muscular tenderness.  Lymphadenopathy:     Cervical: No cervical adenopathy.  Skin:    General: Skin is warm and dry.     Capillary Refill: Capillary refill takes less than 2 seconds.     Findings: Rash (LLE) present. Rash is papular and vesicular.  Neurological:     General: No focal deficit present.     Mental Status: She is alert and oriented to person, place, and time. Mental status is at baseline.  Psychiatric:        Mood and Affect: Mood normal.        Behavior: Behavior normal.        Thought Content: Thought content normal.        Judgment: Judgment normal.     Lab Results  Component Value Date   TSH 1.640 06/06/2021   Lab Results  Component Value Date   WBC 7.0 06/06/2021   HGB 14.4 06/06/2021   HCT 43.1 06/06/2021   MCV 90 06/06/2021   PLT 225 06/06/2021   Lab Results  Component Value Date   NA 141 06/06/2021   K 4.3 06/06/2021   CO2 21 06/06/2021   GLUCOSE 94 06/06/2021   BUN 12 06/06/2021   CREATININE 0.87 06/06/2021   BILITOT 0.4 06/06/2021   ALKPHOS 75 06/06/2021   AST 31 06/06/2021   ALT 46 (H)  06/06/2021   PROT 6.5 06/06/2021   ALBUMIN 4.2 06/06/2021   CALCIUM 9.1 06/06/2021   EGFR 81 06/06/2021   Lab Results  Component Value Date   CHOL 180 06/06/2021   Lab Results  Component Value Date   HDL 41 06/06/2021   Lab Results  Component Value Date   LDLCALC 116 (H) 06/06/2021   Lab Results  Component Value Date   TRIG 128 06/06/2021   Lab Results  Component Value Date   CHOLHDL 4.4 06/06/2021   No results found for: "HGBA1C"

## 2022-08-09 LAB — CMP14+EGFR
ALT: 25 IU/L (ref 0–32)
AST: 17 IU/L (ref 0–40)
Albumin/Globulin Ratio: 1.7 (ref 1.2–2.2)
Albumin: 4.3 g/dL (ref 3.8–4.9)
Alkaline Phosphatase: 88 IU/L (ref 44–121)
BUN/Creatinine Ratio: 18 (ref 9–23)
BUN: 16 mg/dL (ref 6–24)
Bilirubin Total: 0.4 mg/dL (ref 0.0–1.2)
CO2: 20 mmol/L (ref 20–29)
Calcium: 9.5 mg/dL (ref 8.7–10.2)
Chloride: 106 mmol/L (ref 96–106)
Creatinine, Ser: 0.9 mg/dL (ref 0.57–1.00)
Globulin, Total: 2.6 g/dL (ref 1.5–4.5)
Glucose: 92 mg/dL (ref 70–99)
Potassium: 4.3 mmol/L (ref 3.5–5.2)
Sodium: 141 mmol/L (ref 134–144)
Total Protein: 6.9 g/dL (ref 6.0–8.5)
eGFR: 77 mL/min/{1.73_m2} (ref >59)

## 2022-08-09 LAB — CBC WITH DIFFERENTIAL/PLATELET
Basophils Absolute: 0 10*3/uL (ref 0.0–0.2)
Basos: 1 %
EOS (ABSOLUTE): 0.4 10*3/uL (ref 0.0–0.4)
Eos: 8 %
Hematocrit: 43.2 % (ref 34.0–46.6)
Hemoglobin: 14.7 g/dL (ref 11.1–15.9)
Immature Grans (Abs): 0 10*3/uL (ref 0.0–0.1)
Immature Granulocytes: 0 %
Lymphocytes Absolute: 1.6 10*3/uL (ref 0.7–3.1)
Lymphs: 31 %
MCH: 31 pg (ref 26.6–33.0)
MCHC: 34 g/dL (ref 31.5–35.7)
MCV: 91 fL (ref 79–97)
Monocytes Absolute: 0.6 10*3/uL (ref 0.1–0.9)
Monocytes: 12 %
Neutrophils Absolute: 2.4 10*3/uL (ref 1.4–7.0)
Neutrophils: 48 %
Platelets: 237 10*3/uL (ref 150–450)
RBC: 4.74 x10E6/uL (ref 3.77–5.28)
RDW: 13.3 % (ref 11.7–15.4)
WBC: 5.1 10*3/uL (ref 3.4–10.8)

## 2022-08-09 LAB — LIPID PANEL
Chol/HDL Ratio: 4.2 ratio (ref 0.0–4.4)
Cholesterol, Total: 200 mg/dL — ABNORMAL HIGH (ref 100–199)
HDL: 48 mg/dL (ref >39)
LDL Chol Calc (NIH): 128 mg/dL — ABNORMAL HIGH (ref 0–99)
Triglycerides: 135 mg/dL (ref 0–149)
VLDL Cholesterol Cal: 24 mg/dL (ref 5–40)

## 2022-08-09 LAB — RHEUMATOID ARTHRITIS PROFILE
Cyclic Citrullin Peptide Ab: <1 units (ref 0–19)
Rheumatoid fact SerPl-aCnc: <10 IU/mL (ref <14.0)

## 2022-08-09 LAB — VITAMIN D 25 HYDROXY (VIT D DEFICIENCY, FRACTURES): Vit D, 25-Hydroxy: 59.5 ng/mL (ref 30.0–100.0)

## 2022-08-12 MED ORDER — SEMAGLUTIDE-WEIGHT MANAGEMENT 1 MG/0.5ML ~~LOC~~ SOAJ: 1.0000 mg | SUBCUTANEOUS | 0 refills | Status: AC

## 2022-08-12 MED ORDER — SEMAGLUTIDE-WEIGHT MANAGEMENT 0.5 MG/0.5ML ~~LOC~~ SOAJ: 0.5000 mg | SUBCUTANEOUS | 0 refills | Status: AC

## 2022-08-12 MED ORDER — SEMAGLUTIDE-WEIGHT MANAGEMENT 1.7 MG/0.75ML ~~LOC~~ SOAJ: 1.7000 mg | SUBCUTANEOUS | 0 refills | Status: AC

## 2022-08-12 MED ORDER — SEMAGLUTIDE-WEIGHT MANAGEMENT 0.25 MG/0.5ML ~~LOC~~ SOAJ: 0.2500 mg | SUBCUTANEOUS | 0 refills | Status: AC

## 2022-08-12 MED ORDER — SEMAGLUTIDE-WEIGHT MANAGEMENT 2.4 MG/0.75ML ~~LOC~~ SOAJ: 2.4000 mg | SUBCUTANEOUS | 0 refills | Status: AC

## 2022-08-20 ENCOUNTER — Telehealth: Payer: Self-pay | Admitting: *Deleted

## 2022-08-20 DIAGNOSIS — E669 Obesity, unspecified: Secondary | ICD-10-CM

## 2022-08-20 NOTE — Telephone Encounter (Signed)
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

## 2022-08-21 NOTE — Telephone Encounter (Signed)
Pt aware by detailed VM 

## 2022-08-21 NOTE — Telephone Encounter (Signed)
EGBTDV DENIED-per pt's plan weight loss medication not covered

## 2022-08-21 NOTE — Telephone Encounter (Signed)
Please make patient aware

## 2022-08-23 DIAGNOSIS — M792 Neuralgia and neuritis, unspecified: Secondary | ICD-10-CM | POA: Diagnosis not present

## 2022-08-27 DIAGNOSIS — G5601 Carpal tunnel syndrome, right upper limb: Secondary | ICD-10-CM | POA: Insufficient documentation

## 2022-08-27 DIAGNOSIS — M6530 Trigger finger, unspecified finger: Secondary | ICD-10-CM | POA: Insufficient documentation

## 2022-08-27 DIAGNOSIS — G5621 Lesion of ulnar nerve, right upper limb: Secondary | ICD-10-CM | POA: Diagnosis not present

## 2022-09-09 DIAGNOSIS — M79641 Pain in right hand: Secondary | ICD-10-CM | POA: Diagnosis not present

## 2022-09-16 DIAGNOSIS — G5621 Lesion of ulnar nerve, right upper limb: Secondary | ICD-10-CM | POA: Diagnosis not present

## 2022-09-16 DIAGNOSIS — G5601 Carpal tunnel syndrome, right upper limb: Secondary | ICD-10-CM | POA: Diagnosis not present

## 2022-09-16 DIAGNOSIS — Z7951 Long term (current) use of inhaled steroids: Secondary | ICD-10-CM | POA: Diagnosis not present

## 2022-09-16 DIAGNOSIS — Z79899 Other long term (current) drug therapy: Secondary | ICD-10-CM | POA: Diagnosis not present

## 2022-09-16 DIAGNOSIS — J45909 Unspecified asthma, uncomplicated: Secondary | ICD-10-CM | POA: Diagnosis not present

## 2022-09-16 DIAGNOSIS — M65331 Trigger finger, right middle finger: Secondary | ICD-10-CM | POA: Diagnosis not present

## 2022-09-16 DIAGNOSIS — Z6830 Body mass index (BMI) 30.0-30.9, adult: Secondary | ICD-10-CM | POA: Diagnosis not present

## 2022-09-16 DIAGNOSIS — K219 Gastro-esophageal reflux disease without esophagitis: Secondary | ICD-10-CM | POA: Diagnosis not present

## 2022-09-16 DIAGNOSIS — E669 Obesity, unspecified: Secondary | ICD-10-CM | POA: Diagnosis not present

## 2022-09-16 DIAGNOSIS — M65321 Trigger finger, right index finger: Secondary | ICD-10-CM | POA: Diagnosis not present

## 2022-09-16 DIAGNOSIS — F411 Generalized anxiety disorder: Secondary | ICD-10-CM | POA: Diagnosis not present

## 2022-09-16 DIAGNOSIS — M65311 Trigger thumb, right thumb: Secondary | ICD-10-CM | POA: Diagnosis not present

## 2022-09-20 ENCOUNTER — Ambulatory Visit: Payer: BC Managed Care – PPO | Admitting: Nurse Practitioner

## 2022-09-23 DIAGNOSIS — M79641 Pain in right hand: Secondary | ICD-10-CM | POA: Diagnosis not present

## 2022-09-23 DIAGNOSIS — G5601 Carpal tunnel syndrome, right upper limb: Secondary | ICD-10-CM | POA: Diagnosis not present

## 2022-09-23 DIAGNOSIS — M6530 Trigger finger, unspecified finger: Secondary | ICD-10-CM | POA: Diagnosis not present

## 2022-09-23 DIAGNOSIS — G5621 Lesion of ulnar nerve, right upper limb: Secondary | ICD-10-CM | POA: Diagnosis not present

## 2022-09-30 DIAGNOSIS — M6530 Trigger finger, unspecified finger: Secondary | ICD-10-CM | POA: Diagnosis not present

## 2022-09-30 DIAGNOSIS — G5621 Lesion of ulnar nerve, right upper limb: Secondary | ICD-10-CM | POA: Diagnosis not present

## 2022-09-30 DIAGNOSIS — G5601 Carpal tunnel syndrome, right upper limb: Secondary | ICD-10-CM | POA: Diagnosis not present

## 2022-09-30 DIAGNOSIS — M79641 Pain in right hand: Secondary | ICD-10-CM | POA: Diagnosis not present

## 2022-10-01 ENCOUNTER — Ambulatory Visit: Payer: BC Managed Care – PPO | Admitting: Nurse Practitioner

## 2022-10-04 DIAGNOSIS — M19071 Primary osteoarthritis, right ankle and foot: Secondary | ICD-10-CM | POA: Diagnosis not present

## 2022-10-04 DIAGNOSIS — M19072 Primary osteoarthritis, left ankle and foot: Secondary | ICD-10-CM | POA: Diagnosis not present

## 2022-10-04 DIAGNOSIS — M792 Neuralgia and neuritis, unspecified: Secondary | ICD-10-CM | POA: Diagnosis not present

## 2022-10-09 DIAGNOSIS — M79641 Pain in right hand: Secondary | ICD-10-CM | POA: Diagnosis not present

## 2022-10-09 DIAGNOSIS — S93621A Sprain of tarsometatarsal ligament of right foot, initial encounter: Secondary | ICD-10-CM | POA: Diagnosis not present

## 2022-10-09 DIAGNOSIS — G5621 Lesion of ulnar nerve, right upper limb: Secondary | ICD-10-CM | POA: Diagnosis not present

## 2022-10-09 DIAGNOSIS — M19071 Primary osteoarthritis, right ankle and foot: Secondary | ICD-10-CM | POA: Diagnosis not present

## 2022-10-09 DIAGNOSIS — M6530 Trigger finger, unspecified finger: Secondary | ICD-10-CM | POA: Diagnosis not present

## 2022-10-09 DIAGNOSIS — G5601 Carpal tunnel syndrome, right upper limb: Secondary | ICD-10-CM | POA: Diagnosis not present

## 2022-10-09 DIAGNOSIS — M792 Neuralgia and neuritis, unspecified: Secondary | ICD-10-CM | POA: Diagnosis not present

## 2022-10-14 ENCOUNTER — Ambulatory Visit: Payer: BC Managed Care – PPO | Admitting: Nurse Practitioner

## 2022-10-16 DIAGNOSIS — M79641 Pain in right hand: Secondary | ICD-10-CM | POA: Diagnosis not present

## 2022-10-16 DIAGNOSIS — G5621 Lesion of ulnar nerve, right upper limb: Secondary | ICD-10-CM | POA: Diagnosis not present

## 2022-10-16 DIAGNOSIS — M6530 Trigger finger, unspecified finger: Secondary | ICD-10-CM | POA: Diagnosis not present

## 2022-10-16 DIAGNOSIS — G5601 Carpal tunnel syndrome, right upper limb: Secondary | ICD-10-CM | POA: Diagnosis not present

## 2022-10-21 DIAGNOSIS — Y929 Unspecified place or not applicable: Secondary | ICD-10-CM | POA: Diagnosis not present

## 2022-10-21 DIAGNOSIS — M19071 Primary osteoarthritis, right ankle and foot: Secondary | ICD-10-CM | POA: Diagnosis not present

## 2022-10-21 DIAGNOSIS — G8918 Other acute postprocedural pain: Secondary | ICD-10-CM | POA: Diagnosis not present

## 2022-10-21 DIAGNOSIS — X58XXXA Exposure to other specified factors, initial encounter: Secondary | ICD-10-CM | POA: Diagnosis not present

## 2022-10-21 DIAGNOSIS — S93321S Subluxation of tarsometatarsal joint of right foot, sequela: Secondary | ICD-10-CM | POA: Diagnosis not present

## 2022-10-30 DIAGNOSIS — M19071 Primary osteoarthritis, right ankle and foot: Secondary | ICD-10-CM | POA: Diagnosis not present

## 2022-10-30 DIAGNOSIS — S93621D Sprain of tarsometatarsal ligament of right foot, subsequent encounter: Secondary | ICD-10-CM | POA: Diagnosis not present

## 2022-11-11 DIAGNOSIS — J310 Chronic rhinitis: Secondary | ICD-10-CM | POA: Diagnosis not present

## 2022-11-11 DIAGNOSIS — J4530 Mild persistent asthma, uncomplicated: Secondary | ICD-10-CM | POA: Diagnosis not present

## 2022-11-13 DIAGNOSIS — M19071 Primary osteoarthritis, right ankle and foot: Secondary | ICD-10-CM | POA: Diagnosis not present

## 2022-11-13 DIAGNOSIS — M792 Neuralgia and neuritis, unspecified: Secondary | ICD-10-CM | POA: Diagnosis not present

## 2022-11-13 DIAGNOSIS — S93621S Sprain of tarsometatarsal ligament of right foot, sequela: Secondary | ICD-10-CM | POA: Diagnosis not present

## 2022-11-15 ENCOUNTER — Ambulatory Visit: Payer: BC Managed Care – PPO | Admitting: Nurse Practitioner

## 2022-11-18 ENCOUNTER — Encounter: Payer: Self-pay | Admitting: Nurse Practitioner

## 2022-11-21 ENCOUNTER — Other Ambulatory Visit: Payer: Self-pay | Admitting: Family Medicine

## 2022-11-21 DIAGNOSIS — F411 Generalized anxiety disorder: Secondary | ICD-10-CM

## 2022-11-29 ENCOUNTER — Ambulatory Visit: Payer: BC Managed Care – PPO | Admitting: Nurse Practitioner

## 2022-12-06 DIAGNOSIS — S93621D Sprain of tarsometatarsal ligament of right foot, subsequent encounter: Secondary | ICD-10-CM | POA: Diagnosis not present

## 2022-12-27 DIAGNOSIS — M19071 Primary osteoarthritis, right ankle and foot: Secondary | ICD-10-CM | POA: Diagnosis not present

## 2022-12-27 DIAGNOSIS — S93621S Sprain of tarsometatarsal ligament of right foot, sequela: Secondary | ICD-10-CM | POA: Diagnosis not present

## 2023-01-03 DIAGNOSIS — M25471 Effusion, right ankle: Secondary | ICD-10-CM | POA: Diagnosis not present

## 2023-01-03 DIAGNOSIS — R600 Localized edema: Secondary | ICD-10-CM | POA: Diagnosis not present

## 2023-01-03 DIAGNOSIS — M7731 Calcaneal spur, right foot: Secondary | ICD-10-CM | POA: Diagnosis not present

## 2023-01-03 DIAGNOSIS — S86211A Strain of muscle(s) and tendon(s) of anterior muscle group at lower leg level, right leg, initial encounter: Secondary | ICD-10-CM | POA: Diagnosis not present

## 2023-01-03 DIAGNOSIS — M25871 Other specified joint disorders, right ankle and foot: Secondary | ICD-10-CM | POA: Diagnosis not present

## 2023-01-03 DIAGNOSIS — Z967 Presence of other bone and tendon implants: Secondary | ICD-10-CM | POA: Diagnosis not present

## 2023-01-03 DIAGNOSIS — M722 Plantar fascial fibromatosis: Secondary | ICD-10-CM | POA: Diagnosis not present

## 2023-01-16 DIAGNOSIS — M19071 Primary osteoarthritis, right ankle and foot: Secondary | ICD-10-CM | POA: Diagnosis not present

## 2023-01-16 DIAGNOSIS — S93621S Sprain of tarsometatarsal ligament of right foot, sequela: Secondary | ICD-10-CM | POA: Diagnosis not present

## 2023-01-27 DIAGNOSIS — G8918 Other acute postprocedural pain: Secondary | ICD-10-CM | POA: Diagnosis not present

## 2023-01-27 DIAGNOSIS — S86211A Strain of muscle(s) and tendon(s) of anterior muscle group at lower leg level, right leg, initial encounter: Secondary | ICD-10-CM | POA: Diagnosis not present

## 2023-01-27 DIAGNOSIS — X58XXXA Exposure to other specified factors, initial encounter: Secondary | ICD-10-CM | POA: Diagnosis not present

## 2023-01-27 DIAGNOSIS — Y929 Unspecified place or not applicable: Secondary | ICD-10-CM | POA: Diagnosis not present

## 2023-02-03 DIAGNOSIS — S86219A Strain of muscle(s) and tendon(s) of anterior muscle group at lower leg level, unspecified leg, initial encounter: Secondary | ICD-10-CM | POA: Diagnosis not present

## 2023-02-03 DIAGNOSIS — M19071 Primary osteoarthritis, right ankle and foot: Secondary | ICD-10-CM | POA: Diagnosis not present

## 2023-02-05 ENCOUNTER — Other Ambulatory Visit: Payer: Self-pay | Admitting: Family Medicine

## 2023-02-05 DIAGNOSIS — K219 Gastro-esophageal reflux disease without esophagitis: Secondary | ICD-10-CM

## 2023-02-17 ENCOUNTER — Encounter: Payer: Self-pay | Admitting: Nurse Practitioner

## 2023-02-17 ENCOUNTER — Other Ambulatory Visit: Payer: Self-pay | Admitting: Family Medicine

## 2023-02-17 DIAGNOSIS — K219 Gastro-esophageal reflux disease without esophagitis: Secondary | ICD-10-CM

## 2023-02-17 DIAGNOSIS — F411 Generalized anxiety disorder: Secondary | ICD-10-CM

## 2023-02-17 NOTE — Telephone Encounter (Signed)
Letter sent.

## 2023-02-26 ENCOUNTER — Telehealth: Payer: Self-pay

## 2023-02-26 DIAGNOSIS — F411 Generalized anxiety disorder: Secondary | ICD-10-CM

## 2023-02-26 MED ORDER — FLUOXETINE HCL 20 MG PO CAPS: 20.0000 mg | ORAL_CAPSULE | Freq: Every day | ORAL | 0 refills | Status: DC

## 2023-02-26 MED ORDER — HYDROXYZINE PAMOATE 25 MG PO CAPS: 25.0000 mg | ORAL_CAPSULE | Freq: Three times a day (TID) | ORAL | 0 refills | Status: AC | PRN

## 2023-02-26 NOTE — Telephone Encounter (Signed)
Patient aware and verbalized understanding. °

## 2023-02-26 NOTE — Telephone Encounter (Signed)
Patient has appt on 4/23 to establish here with Alvie Heidelberg.  Wants to know if she will be okay if she runs out of her Prozac and Vistaril (side effects). Patient understands she needs to be seen to get medication filled.

## 2023-03-10 DIAGNOSIS — M2550 Pain in unspecified joint: Secondary | ICD-10-CM | POA: Diagnosis not present

## 2023-03-25 ENCOUNTER — Other Ambulatory Visit: Payer: Self-pay | Admitting: Family Medicine

## 2023-03-25 ENCOUNTER — Ambulatory Visit: Payer: BC Managed Care – PPO | Admitting: Family Medicine

## 2023-03-25 DIAGNOSIS — F411 Generalized anxiety disorder: Secondary | ICD-10-CM

## 2023-03-25 DIAGNOSIS — K219 Gastro-esophageal reflux disease without esophagitis: Secondary | ICD-10-CM

## 2023-03-26 ENCOUNTER — Encounter: Payer: Self-pay | Admitting: Nurse Practitioner

## 2023-04-09 ENCOUNTER — Ambulatory Visit: Payer: BC Managed Care – PPO | Admitting: Family Medicine

## 2023-04-29 ENCOUNTER — Other Ambulatory Visit: Payer: Self-pay | Admitting: Family Medicine

## 2023-04-29 DIAGNOSIS — F411 Generalized anxiety disorder: Secondary | ICD-10-CM

## 2023-04-29 DIAGNOSIS — K219 Gastro-esophageal reflux disease without esophagitis: Secondary | ICD-10-CM

## 2023-05-01 ENCOUNTER — Encounter: Payer: Self-pay | Admitting: Family Medicine

## 2023-05-01 ENCOUNTER — Ambulatory Visit (INDEPENDENT_AMBULATORY_CARE_PROVIDER_SITE_OTHER): Payer: BC Managed Care – PPO | Admitting: Family Medicine

## 2023-05-01 VITALS — BP 109/74 | HR 70 | Temp 98.5°F | Ht 63.0 in | Wt 178.0 lb

## 2023-05-01 DIAGNOSIS — E782 Mixed hyperlipidemia: Secondary | ICD-10-CM

## 2023-05-01 DIAGNOSIS — R899 Unspecified abnormal finding in specimens from other organs, systems and tissues: Secondary | ICD-10-CM

## 2023-05-01 DIAGNOSIS — K219 Gastro-esophageal reflux disease without esophagitis: Secondary | ICD-10-CM | POA: Diagnosis not present

## 2023-05-01 DIAGNOSIS — F411 Generalized anxiety disorder: Secondary | ICD-10-CM | POA: Diagnosis not present

## 2023-05-01 LAB — CMP14+EGFR
ALT: 27 IU/L (ref 0–32)
BUN: 12 mg/dL (ref 6–24)
Bilirubin Total: 0.3 mg/dL (ref 0.0–1.2)
Calcium: 9.1 mg/dL (ref 8.7–10.2)
Chloride: 106 mmol/L (ref 96–106)
Glucose: 91 mg/dL (ref 70–99)
Sodium: 140 mmol/L (ref 134–144)

## 2023-05-01 LAB — LIPID PANEL
Chol/HDL Ratio: 4.4 ratio (ref 0.0–4.4)
Cholesterol, Total: 191 mg/dL (ref 100–199)

## 2023-05-01 LAB — CBC WITH DIFFERENTIAL/PLATELET
Basos: 1 %
Immature Granulocytes: 0 %
Neutrophils Absolute: 2.2 10*3/uL (ref 1.4–7.0)
WBC: 4.8 10*3/uL (ref 3.4–10.8)

## 2023-05-01 LAB — C-REACTIVE PROTEIN

## 2023-05-01 LAB — VITAMIN D 25 HYDROXY (VIT D DEFICIENCY, FRACTURES)

## 2023-05-01 LAB — TSH

## 2023-05-01 LAB — ANA COMPREHENSIVE PANEL

## 2023-05-01 MED ORDER — HYDROXYZINE PAMOATE 25 MG PO CAPS
25.0000 mg | ORAL_CAPSULE | Freq: Three times a day (TID) | ORAL | 1 refills | Status: DC | PRN
Start: 2023-05-01 — End: 2023-06-24

## 2023-05-01 MED ORDER — FLUOXETINE HCL 20 MG PO CAPS
20.0000 mg | ORAL_CAPSULE | Freq: Every day | ORAL | 0 refills | Status: DC
Start: 2023-05-01 — End: 2023-07-21

## 2023-05-01 MED ORDER — OMEPRAZOLE 40 MG PO CPDR: DELAYED_RELEASE_CAPSULE | ORAL | 0 refills | Status: DC

## 2023-05-01 NOTE — Progress Notes (Signed)
New Patient Office Visit  Subjective   Patient ID: Teresa Woodard, female    DOB: 1971/09/20  Age: 52 y.o. MRN: [id number]  CC:  Chief Complaint  Patient presents with   Establish Care   HPI Teresa Woodard presents to establish care States that she is followed by ortho. She states that she had repair in October of bones on top of her foot, unsure of how she broke them, they just started bothering her. Then she had repair of tendon in February. Surgeon completed ANA because of the amount of arthritis he saw during surgery. Positive and recommended follow up.   GERD  States that it is okay. She has a history of stretching several years ago. Had EGD. Denies blood in stool or coughing up blood.   Depression/Anxiety  States that it is okay, everything comes in spells. 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 She continues to have some chest palpitations from anxiety. Husband had tree fall on him and crush femur, lost several friends.   Outpatient Encounter Medications as of 05/01/2023  Medication Sig   albuterol (VENTOLIN HFA) 108 (90 Base) MCG/ACT inhaler Inhale 2 puffs into the lungs every 6 (six) hours as needed for wheezing or shortness of breath.   Ascorbic Acid (VITAMIN C WITH ROSE HIPS) 1000 MG tablet Take 1,000 mg by mouth daily.   cholecalciferol (VITAMIN D) 1000 units tablet Take 1,000 Units by mouth daily.   FLUoxetine (PROZAC) 20 MG capsule TAKE ONE CAPSULE DAILY   fluticasone-salmeterol (ADVAIR) 100-50 MCG/ACT AEPB Inhale 1 puff into the lungs daily.   gabapentin (NEURONTIN) 100 MG capsule Take 100 mg by mouth at bedtime.   Multiple Vitamin (MULTIVITAMIN) tablet Take 1 tablet by mouth daily.   omeprazole (PRILOSEC) 40 MG capsule TAKE ONE CAPSULE ONCE DAILY. needs TO be seen BEFORE NEXT refill   Zinc 50 MG CAPS Take 1 capsule by mouth daily.   [DISCONTINUED] fluticasone (FLONASE) 50 MCG/ACT nasal spray Place 2 sprays into both nostrils daily.    [DISCONTINUED] meloxicam (MOBIC) 15 MG tablet Take 15 mg by mouth daily.   No facility-administered encounter medications on file as of 05/01/2023.    Past Medical History:  Diagnosis Date   Allergy    Asthma     Past Surgical History:  Procedure Laterality Date   TUBAL LIGATION      Family History  Problem Relation Age of Onset   Arthritis/Rheumatoid Mother    Cancer Mother        breast, uterine,lung   Breast cancer Mother 35   Breast cancer Paternal Aunt    Colitis Neg Hx    Esophageal cancer Neg Hx    Stomach cancer Neg Hx    Rectal cancer Neg Hx     Social History   Socioeconomic History   Marital status: Married    Spouse name: Not on file   Number of children: Not on file   Years of education: Not on file   Highest education level: Not on file  Occupational History   Not on file  Tobacco Use   Smoking status: Never   Smokeless tobacco: Never  Vaping Use   Vaping Use: Never used  Substance and Sexual Activity   Alcohol use: No   Drug use: No   Sexual activity: Not on file  Other Topics Concern   Not on file  Social History Narrative   Not on file   Social Determinants of Health  Financial Resource Strain: Not on file  Food Insecurity: Not on file  Transportation Needs: Not on file  Physical Activity: Not on file  Stress: Not on file  Social Connections: Not on file  Intimate Partner Violence: Not on file    ROS As per HPI   Objective   BP 109/74   Pulse 70   Temp 98.5 F (36.9 C)   Ht 5\' 3"  (1.6 m)   Wt 178 lb (80.7 kg)   LMP 01/02/2022   SpO2 97%   BMI 31.53 kg/m   Physical Exam Constitutional:      General: She is awake. She is not in acute distress.    Appearance: Normal appearance. She is well-developed and well-groomed. She is not ill-appearing, toxic-appearing or diaphoretic.  Cardiovascular:     Rate and Rhythm: Normal rate.     Pulses: Normal pulses.          Radial pulses are 2+ on the right side and 2+ on the left  side.       Posterior tibial pulses are 2+ on the right side and 2+ on the left side.     Heart sounds: Normal heart sounds. No murmur heard.    No gallop.  Pulmonary:     Effort: Pulmonary effort is normal. No respiratory distress.     Breath sounds: Normal breath sounds. No stridor. No wheezing, rhonchi or rales.  Musculoskeletal:     Cervical back: Full passive range of motion without pain and neck supple.     Right lower leg: No edema.     Left lower leg: No edema.  Skin:    General: Skin is warm.     Capillary Refill: Capillary refill takes less than 2 seconds.  Neurological:     General: No focal deficit present.     Mental Status: She is alert, oriented to person, place, and time and easily aroused. Mental status is at baseline.     GCS: GCS eye subscore is 4. GCS verbal subscore is 5. GCS motor subscore is 6.     Motor: No weakness.  Psychiatric:        Attention and Perception: Attention and perception normal.        Mood and Affect: Mood and affect normal.        Speech: Speech normal.        Behavior: Behavior normal. Behavior is cooperative.        Thought Content: Thought content normal. Thought content does not include homicidal or suicidal ideation. Thought content does not include homicidal or suicidal plan.        Cognition and Memory: Cognition and memory normal.        Judgment: Judgment normal.       05/01/2023    9:10 AM 08/08/2022   10:07 AM 07/04/2021    9:47 AM  Depression screen PHQ 2/9  Decreased Interest 0 0 0  Down, Depressed, Hopeless 0 0 0  PHQ - 2 Score 0 0 0  Altered sleeping 2 1   Tired, decreased energy 1 0   Change in appetite 0 0   Feeling bad or failure about yourself  0 0   Trouble concentrating 0 0   Moving slowly or fidgety/restless 0 0   Suicidal thoughts 0 0   PHQ-9 Score 3 1   Difficult doing work/chores Not difficult at all Somewhat difficult       05/01/2023    9:11 AM 08/08/2022   10:07  AM 06/06/2021    9:36 AM 03/24/2020    8:11  AM  GAD 7 : Generalized Anxiety Score  Nervous, Anxious, on Edge 1 1 1 1   Control/stop worrying 1 2 0 0  Worry too much - different things 1 2 0 0  Trouble relaxing 0 2 0 1  Restless 0 2 0 0  Easily annoyed or irritable 0 0 0 0  Afraid - awful might happen 1 0 0 0  Total GAD 7 Score 4 9 1 2   Anxiety Difficulty Not difficult at all Somewhat difficult Not difficult at all Not difficult at all   Assessment & Plan:  1. GAD (generalized anxiety disorder) Well controlled. Refills as below. Declines counseling, psychology referral at this time. Denies SI.  - FLUoxetine (PROZAC) 20 MG capsule; Take 1 capsule (20 mg total) by mouth daily.  Dispense: 90 capsule; Refill: 0 - hydrOXYzine (VISTARIL) 25 MG capsule; Take 1 capsule (25 mg total) by mouth 3 (three) times daily as needed.  Dispense: 90 capsule; Refill: 1  2. GERD without esophagitis Well controlled.  - omeprazole (PRILOSEC) 40 MG capsule; TAKE ONE CAPSULE ONCE DAILY. needs TO be seen BEFORE NEXT refill  Dispense: 90 capsule; Refill: 0  3. Abnormal laboratory test result Labs as below. Will communicate results to patient once available.  - [facility] - CBC with Differential/Platelet - CMP14+EGFR - TSH - VITAMIN D 25 Hydroxy (Vit-D Deficiency, Fractures) - C-reactive protein - ANA Comprehensive Panel  4. Mixed hyperlipidemia Labs as below. Will communicate results to patient once available.  Fasting - Lipid panel  The above assessment and management plan was discussed with the patient. The patient verbalized understanding of and has agreed to the management plan using shared-decision making. Patient is aware to call the clinic if they develop any new symptoms or if symptoms fail to improve or worsen. Patient is aware when to return to the clinic for a follow-up visit. Patient educated on when it is appropriate to go to the emergency department.   Return in about 6 weeks (around 06/12/2023) for CPE.   Neale Burly,  DNP-FNP [facility] [address] [place], [place] [address] [phone]

## 2023-05-02 LAB — CBC WITH DIFFERENTIAL/PLATELET
Hematocrit: 39.7 % (ref 34.0–46.6)
Lymphs: 35 %
Monocytes Absolute: 0.5 10*3/uL (ref 0.1–0.9)

## 2023-05-02 LAB — LIPID PANEL: HDL: 43 mg/dL (ref >39)

## 2023-05-02 LAB — RHEUMASSURE

## 2023-05-02 LAB — CMP14+EGFR
Albumin/Globulin Ratio: 1.9 (ref 1.2–2.2)
Albumin: 4.1 g/dL (ref 3.8–4.9)
Alkaline Phosphatase: 100 IU/L (ref 44–121)

## 2023-05-02 LAB — ANA COMPREHENSIVE PANEL
Scleroderma (Scl-70) (ENA) Antibody, IgG: <0.2 AI (ref 0.0–0.9)
dsDNA Ab: <1 IU/mL (ref 0–9)

## 2023-05-07 LAB — CBC WITH DIFFERENTIAL/PLATELET
Eos: 7 %
Monocytes: 11 %
RBC: 4.49 x10E6/uL (ref 3.77–5.28)

## 2023-05-07 LAB — ANA COMPREHENSIVE PANEL
Chromatin Ab SerPl-aCnc: <0.2 AI (ref 0.0–0.9)
ENA SM Ab Ser-aCnc: <0.2 AI (ref 0.0–0.9)
ENA SSA (RO) Ab: <0.2 AI (ref 0.0–0.9)

## 2023-05-07 LAB — LIPID PANEL
LDL Chol Calc (NIH): 123 mg/dL — ABNORMAL HIGH (ref 0–99)
Triglycerides: 139 mg/dL (ref 0–149)

## 2023-05-07 LAB — CMP14+EGFR
AST: 20 IU/L (ref 0–40)
BUN/Creatinine Ratio: 12 (ref 9–23)
CO2: 20 mmol/L (ref 20–29)
eGFR: 66 mL/min/{1.73_m2} (ref >59)

## 2023-05-11 LAB — CBC WITH DIFFERENTIAL/PLATELET
Basophils Absolute: 0 10*3/uL (ref 0.0–0.2)
EOS (ABSOLUTE): 0.4 10*3/uL (ref 0.0–0.4)
Hemoglobin: 13.8 g/dL (ref 11.1–15.9)
Immature Grans (Abs): 0 10*3/uL (ref 0.0–0.1)
Lymphocytes Absolute: 1.7 10*3/uL (ref 0.7–3.1)
MCH: 30.7 pg (ref 26.6–33.0)
MCHC: 34.8 g/dL (ref 31.5–35.7)
MCV: 88 fL (ref 79–97)
Neutrophils: 46 %
Platelets: 231 10*3/uL (ref 150–450)
RDW: 13.3 % (ref 11.7–15.4)

## 2023-05-11 LAB — RHEUMASSURE
14.3.3 ETA, Rheum. Arthritis: <0.2 ng/mL
CCP Antibodies IgG/IgA: <20 Units

## 2023-05-11 LAB — ANA COMPREHENSIVE PANEL
Anti JO-1: <0.2 AI (ref 0.0–0.9)
ENA RNP Ab: <0.2 AI (ref 0.0–0.9)
ENA SSB (LA) Ab: <0.2 AI (ref 0.0–0.9)

## 2023-05-11 LAB — CMP14+EGFR
Creatinine, Ser: 1.02 mg/dL — ABNORMAL HIGH (ref 0.57–1.00)
Globulin, Total: 2.2 g/dL (ref 1.5–4.5)
Potassium: 4 mmol/L (ref 3.5–5.2)
Total Protein: 6.3 g/dL (ref 6.0–8.5)

## 2023-05-11 LAB — LIPID PANEL: VLDL Cholesterol Cal: 25 mg/dL (ref 5–40)

## 2023-05-13 NOTE — Progress Notes (Signed)
Crt slightly elevated. Can be a sign of dehydration. Recommend 80-100 oz of water per day. LDL slightly elevated. ASCVD risk is still low. Diet encouraged - increase intake of fresh fruits and vegetables, increase intake of lean proteins. Bake, broil, or grill foods. Avoid fried, greasy, and fatty foods. Avoid fast foods. Increase intake of fiber-rich whole grains. Exercise encouraged - at least 150 minutes per week and advance as tolerated. Can also try red yeast rice and we will recheck in 3 months. All other labs normal.   The 10-year ASCVD risk score (Arnett DK, et al., 2019) is: 1.5%   Values used to calculate the score:     Age: 52 years     Sex: Female     Is Non-Hispanic African American: No     Diabetic: No     Tobacco smoker: No     Systolic Blood Pressure: 109 mmHg     Is BP treated: No     HDL Cholesterol: 43 mg/dL     Total Cholesterol: 191 mg/dL

## 2023-06-04 ENCOUNTER — Telehealth (INDEPENDENT_AMBULATORY_CARE_PROVIDER_SITE_OTHER): Payer: BC Managed Care – PPO | Admitting: Family Medicine

## 2023-06-04 ENCOUNTER — Encounter: Payer: Self-pay | Admitting: Family Medicine

## 2023-06-04 DIAGNOSIS — H1013 Acute atopic conjunctivitis, bilateral: Secondary | ICD-10-CM | POA: Diagnosis not present

## 2023-06-04 DIAGNOSIS — Z8619 Personal history of other infectious and parasitic diseases: Secondary | ICD-10-CM

## 2023-06-04 DIAGNOSIS — J0140 Acute pansinusitis, unspecified: Secondary | ICD-10-CM

## 2023-06-04 MED ORDER — OLOPATADINE HCL 0.2 % OP SOLN
1.0000 [drp] | Freq: Every day | OPHTHALMIC | 0 refills | Status: DC
Start: 2023-06-04 — End: 2024-07-30

## 2023-06-04 MED ORDER — FLUCONAZOLE 150 MG PO TABS
150.0000 mg | ORAL_TABLET | Freq: Once | ORAL | 1 refills | Status: AC
Start: 2023-06-04 — End: 2023-06-04

## 2023-06-04 MED ORDER — AMOXICILLIN-POT CLAVULANATE 875-125 MG PO TABS
1.0000 | ORAL_TABLET | Freq: Two times a day (BID) | ORAL | 0 refills | Status: AC
Start: 2023-06-04 — End: 2023-06-14

## 2023-06-04 NOTE — Progress Notes (Signed)
Virtual Visit via Video   I connected with patient on 06/04/23 at 1430 by a video enabled telemedicine application and verified that I am speaking with the correct person using two identifiers.  Location patient: Home Location provider: [facility] Persons participating in the virtual visit: Patient and Provider  I discussed the limitations of evaluation and management by telemedicine and the availability of in person appointments. The patient expressed understanding and agreed to proceed.  Subjective:   HPI:  Pt presents today for  Chief Complaint  Patient presents with   Sinusitis   Pt reports ongoing sinus pressure with ear fullness, nasal congestion, and sore throat. Has been taking OTC medications for over a week without relief of symptoms. States she also has eye watering and pruritus. No injuries or changes in vision.   Review of Systems  Constitutional:  Negative for chills, diaphoresis, fever, malaise/fatigue and weight loss.  HENT:  Positive for congestion, ear pain, sinus pain and sore throat. Negative for ear discharge, hearing loss, nosebleeds and tinnitus.   Eyes:  Positive for discharge and redness. Negative for blurred vision, double vision, photophobia and pain.  Respiratory:  Positive for cough and sputum production. Negative for hemoptysis, shortness of breath, wheezing and stridor.   Cardiovascular:  Negative for chest pain, palpitations, orthopnea, claudication, leg swelling and PND.  Gastrointestinal: Negative.   Genitourinary: Negative.   Musculoskeletal: Negative.   Skin:  Negative for itching and rash.  Neurological:  Positive for headaches. Negative for dizziness, tingling, tremors, sensory change, speech change, focal weakness, seizures, loss of consciousness and weakness.  Endo/Heme/Allergies: Negative.   Psychiatric/Behavioral: Negative.    All other systems reviewed and are negative.    Patient Active Problem List    Diagnosis Date Noted   GAD (generalized anxiety disorder) 09/29/2019   Vitamin D deficiency 08/27/2019   Family history of breast cancer in mother 08/27/2019   GERD without esophagitis 08/27/2019   Asthma 08/27/2019   Alopecia areata 08/27/2019   Obesity (BMI 30.0-34.9) 08/27/2019    Social History   Tobacco Use   Smoking status: Never   Smokeless tobacco: Never  Substance Use Topics   Alcohol use: No    Current Outpatient Medications:    amoxicillin-clavulanate (AUGMENTIN) 875-125 MG tablet, Take 1 tablet by mouth 2 (two) times daily for 10 days., Disp: 20 tablet, Rfl: 0   fluconazole (DIFLUCAN) 150 MG tablet, Take 1 tablet (150 mg total) by mouth once for 1 dose., Disp: 1 tablet, Rfl: 1   Olopatadine HCl 0.2 % SOLN, Apply 1 drop to eye daily., Disp: 2.5 mL, Rfl: 0   albuterol (VENTOLIN HFA) 108 (90 Base) MCG/ACT inhaler, Inhale 2 puffs into the lungs every 6 (six) hours as needed for wheezing or shortness of breath., Disp: 18 g, Rfl: 3   Ascorbic Acid (VITAMIN C WITH ROSE HIPS) 1000 MG tablet, Take 1,000 mg by mouth daily., Disp: , Rfl:    cholecalciferol (VITAMIN D) 1000 units tablet, Take 1,000 Units by mouth daily., Disp: , Rfl:    FLUoxetine (PROZAC) 20 MG capsule, Take 1 capsule (20 mg total) by mouth daily., Disp: 90 capsule, Rfl: 0   fluticasone-salmeterol (ADVAIR) 100-50 MCG/ACT AEPB, Inhale 1 puff into the lungs daily., Disp: , Rfl:    gabapentin (NEURONTIN) 100 MG capsule, Take 100 mg by mouth at bedtime., Disp: , Rfl:    hydrOXYzine (VISTARIL) 25 MG capsule, Take 1 capsule (25 mg total) by mouth 3 (three) times daily as  needed., Disp: 90 capsule, Rfl: 1   Multiple Vitamin (MULTIVITAMIN) tablet, Take 1 tablet by mouth daily., Disp: , Rfl:    omeprazole (PRILOSEC) 40 MG capsule, TAKE ONE CAPSULE ONCE DAILY. needs TO be seen BEFORE NEXT refill, Disp: 90 capsule, Rfl: 0   Zinc 50 MG CAPS, Take 1 capsule by mouth daily., Disp: , Rfl:   No Known Allergies  Objective:    LMP 01/02/2022   Patient is well-developed, well-nourished in no acute distress.  Resting comfortably at home.  Head is normocephalic, atraumatic.  No labored breathing.  Speech is clear and coherent with logical content.  Patient is alert and oriented at baseline.    Assessment and Plan:   Teresa Woodard was seen today for sinusitis.  Diagnoses and all orders for this visit:  Acute non-recurrent pansinusitis Has tried and failed conservative therapy at home. Will start below. Continue Mucinex with plenty of water and antihistamines. Report new, worsening, or persistent symptoms.  -     amoxicillin-clavulanate (AUGMENTIN) 875-125 MG tablet; Take 1 tablet by mouth 2 (two) times daily for 10 days.  Allergic conjunctivitis of both eyes No indications of bacterial conjunctivitis. Will trial below. Pt aware to report new, worsening, or persistent symptoms.  -     Olopatadine HCl 0.2 % SOLN; Apply 1 drop to eye daily.  History of candidiasis Aware of when to take and to only take if needed.  -     fluconazole (DIFLUCAN) 150 MG tablet; Take 1 tablet (150 mg total) by mouth once for 1 dose.      Return if symptoms worsen or fail to improve.  Kari Baars, FNP-C [facility] [address] [place], [place] [address] [phone]  06/04/2023  Time spent with the patient: 15 minutes, of which >50% was spent in obtaining information about symptoms, reviewing previous labs, evaluations, and treatments, counseling about condition (please see the discussed topics above), and developing a plan to further investigate it; had a number of questions which I addressed.

## 2023-06-23 ENCOUNTER — Other Ambulatory Visit: Payer: Self-pay | Admitting: Family Medicine

## 2023-06-23 DIAGNOSIS — F411 Generalized anxiety disorder: Secondary | ICD-10-CM

## 2023-07-21 ENCOUNTER — Other Ambulatory Visit: Payer: Self-pay | Admitting: Family Medicine

## 2023-07-21 DIAGNOSIS — F411 Generalized anxiety disorder: Secondary | ICD-10-CM

## 2023-07-21 DIAGNOSIS — K219 Gastro-esophageal reflux disease without esophagitis: Secondary | ICD-10-CM

## 2023-07-22 DIAGNOSIS — S86219A Strain of muscle(s) and tendon(s) of anterior muscle group at lower leg level, unspecified leg, initial encounter: Secondary | ICD-10-CM | POA: Diagnosis not present

## 2023-07-30 ENCOUNTER — Encounter: Payer: Self-pay | Admitting: Family Medicine

## 2023-07-30 ENCOUNTER — Ambulatory Visit (INDEPENDENT_AMBULATORY_CARE_PROVIDER_SITE_OTHER): Payer: BC Managed Care – PPO | Admitting: Family Medicine

## 2023-07-30 VITALS — BP 106/69 | HR 83 | Temp 98.5°F | Ht 63.0 in | Wt 180.0 lb

## 2023-07-30 DIAGNOSIS — Z0001 Encounter for general adult medical examination with abnormal findings: Secondary | ICD-10-CM

## 2023-07-30 DIAGNOSIS — Z6831 Body mass index (BMI) 31.0-31.9, adult: Secondary | ICD-10-CM

## 2023-07-30 DIAGNOSIS — M2550 Pain in unspecified joint: Secondary | ICD-10-CM

## 2023-07-30 DIAGNOSIS — Z0000 Encounter for general adult medical examination without abnormal findings: Secondary | ICD-10-CM

## 2023-07-30 NOTE — Progress Notes (Signed)
Teresa Woodard is a 52 y.o. female presents to office today for annual physical exam examination.    Concerns today include: 1. Denies concerns   Occupation: accounting  Marital status: female, married  Diet: has cut out drinks  Is trying to be balanced.  Exercise: unable to exercise due to recent ankle surgery right  Substance use: none  Last eye exam: due this year  Last dental exam: due  Last colonoscopy: due 2026  Last mammogram: scheduled with ob  Last pap smear: with Ob, will collect records  Contraceptive: tubal ligation, post menopausal  Refills needed today: none  Other specialists seen: Ob/gyn, ortho  Dermatology exam: years ago Fasting today:  no  Immunizations needed: Flu Vaccine: no  Tdap Vaccine: yes  - every 73yrs - (<3 lifetime doses or unknown): all wounds -- look up need for Tetanus IG - (>=3 lifetime doses): clean/minor wound if >43yrs from previous; all other wounds if >67yrs from previous Zoster Vaccine: no (those >50yo, once) Pneumonia Vaccine: no (those w/ risk factors) - (<18yr) Both: Immunocompromised, cochlear implant, CSF leak, asplenic, sickle cell, Chronic Renal Failure - (<7yr) PPSV-23 only: Heart dz, lung disease, DM, tobacco abuse, alcoholism, cirrhosis/liver disease. - (>50yr): PPSV13 then PPSV23 in 6-12mths;  - (>45yr): repeat PPSV23 once if pt received prior to 52yo and 80yrs have passed  Past Medical History:  Diagnosis Date   Allergy    Asthma    Social History   Socioeconomic History   Marital status: Married    Spouse name: Not on file   Number of children: Not on file   Years of education: Not on file   Highest education level: Not on file  Occupational History   Not on file  Tobacco Use   Smoking status: Never   Smokeless tobacco: Never  Vaping Use   Vaping status: Never Used  Substance and Sexual Activity   Alcohol use: No   Drug use: No   Sexual activity: Not on file  Other Topics Concern   Not on file   Social History Narrative   Not on file   Social Determinants of Health   Financial Resource Strain: Not on file  Food Insecurity: Not on file  Transportation Needs: Not on file  Physical Activity: Not on file  Stress: Not on file  Social Connections: Unknown (09/16/2022)   Received from [facility], [facility]   Social Network    Social Network: Not on file  Intimate Partner Violence: Unknown (09/16/2022)   Received from [employer], [facility]   HITS    Physically Hurt: Not on file    Insult or Talk Down To: Not on file    Threaten Physical Harm: Not on file    Scream or Curse: Not on file   Past Surgical History:  Procedure Laterality Date   TUBAL LIGATION     Family History  Problem Relation Age of Onset   Arthritis/Rheumatoid Mother    Cancer Mother        breast, uterine,lung   Breast cancer Mother 84   Breast cancer Paternal Aunt    Colitis Neg Hx    Esophageal cancer Neg Hx    Stomach cancer Neg Hx    Rectal cancer Neg Hx     Current Outpatient Medications:    albuterol (VENTOLIN HFA) 108 (90 Base) MCG/ACT inhaler, Inhale 2 puffs into the lungs every 6 (six) hours as needed for wheezing or shortness of breath., Disp: 18 g, Rfl:  3   Ascorbic Acid (VITAMIN C WITH ROSE HIPS) 1000 MG tablet, Take 1,000 mg by mouth daily., Disp: , Rfl:    cholecalciferol (VITAMIN D) 1000 units tablet, Take 1,000 Units by mouth daily., Disp: , Rfl:    FLUoxetine (PROZAC) 20 MG capsule, TAKE ONE CAPSULE DAILY, Disp: 90 capsule, Rfl: 0   fluticasone-salmeterol (ADVAIR) 100-50 MCG/ACT AEPB, Inhale 1 puff into the lungs daily., Disp: , Rfl:    gabapentin (NEURONTIN) 100 MG capsule, Take 100 mg by mouth at bedtime., Disp: , Rfl:    hydrOXYzine (VISTARIL) 25 MG capsule, TAKE ONE CAPSULE THREE TIMES DAILY AS NEEDED, Disp: 90 capsule, Rfl: 1   Multiple Vitamin (MULTIVITAMIN) tablet, Take 1 tablet by mouth daily., Disp: , Rfl:    Olopatadine HCl 0.2 % SOLN, Apply 1 drop to eye  daily., Disp: 2.5 mL, Rfl: 0   omeprazole (PRILOSEC) 40 MG capsule, TAKE ONE CAPSULE DAILY, Disp: 90 capsule, Rfl: 0   Zinc 50 MG CAPS, Take 1 capsule by mouth daily., Disp: , Rfl:   No Known Allergies   ROS: Review of Systems Review of Systems  All other systems reviewed and are negative.   Physical exam    07/30/2023    1:00 PM 05/01/2023    8:57 AM 08/08/2022   10:01 AM  Vitals with BMI  Height 5\' 3"  5\' 3"  5\' 3"   Weight 180 lbs 178 lbs 176 lbs  BMI 31.89 31.54 31.18  Systolic 106 109 409  Diastolic 69 74 64  Pulse 83 70 74    Physical Exam Constitutional:      General: She is awake. She is not in acute distress.    Appearance: Normal appearance. She is well-developed. She is obese. She is not ill-appearing, toxic-appearing or diaphoretic.  HENT:     Head:     Salivary Glands: Right salivary gland is not diffusely enlarged or tender. Left salivary gland is not diffusely enlarged or tender.     Right Ear: Hearing, tympanic membrane, ear canal and external ear normal.     Left Ear: Hearing, tympanic membrane, ear canal and external ear normal.     Nose: No nasal deformity, septal deviation, congestion or rhinorrhea.     Right Sinus: No maxillary sinus tenderness or frontal sinus tenderness.     Left Sinus: No maxillary sinus tenderness or frontal sinus tenderness.     Mouth/Throat:     Lips: Pink. No lesions.     Mouth: Mucous membranes are moist. No injury or oral lesions.     Tongue: No lesions.     Palate: No mass.     Pharynx: Oropharynx is clear. Uvula midline. No pharyngeal swelling or oropharyngeal exudate.     Tonsils: No tonsillar exudate or tonsillar abscesses.  Eyes:     Pupils: Pupils are equal, round, and reactive to light. Pupils are equal.  Neck:     Vascular: No carotid bruit.     Trachea: Trachea and phonation normal.  Cardiovascular:     Rate and Rhythm: Normal rate and regular rhythm.     Pulses:          Carotid pulses are 2+ on the right side and  2+ on the left side.      Posterior tibial pulses are 2+ on the right side and 2+ on the left side.     Heart sounds: Normal heart sounds.  Pulmonary:     Effort: Pulmonary effort is normal.     Breath  sounds: Normal breath sounds. No stridor, decreased air movement or transmitted upper airway sounds. No decreased breath sounds, wheezing, rhonchi or rales.  Abdominal:     General: Abdomen is flat. Bowel sounds are normal.     Palpations: Abdomen is soft.     Tenderness: There is no abdominal tenderness.     Hernia: No hernia is present.  Musculoskeletal:     Cervical back: Full passive range of motion without pain.     Right lower leg: No edema.     Left lower leg: No edema.     Comments: Brace on right ankle   Lymphadenopathy:     Head:     Right side of head: No submental, submandibular, tonsillar or preauricular adenopathy.     Left side of head: No submental, submandibular, tonsillar or preauricular adenopathy.     Cervical: No cervical adenopathy.  Skin:    General: Skin is warm.     Capillary Refill: Capillary refill takes less than 2 seconds.  Neurological:     General: No focal deficit present.     Mental Status: She is alert, oriented to person, place, and time and easily aroused. Mental status is at baseline.     Cranial Nerves: Cranial nerves 2-12 are intact.  Psychiatric:        Attention and Perception: Attention and perception normal.        Mood and Affect: Mood and affect normal.        Speech: Speech normal.        Behavior: Behavior normal. Behavior is cooperative.        Thought Content: Thought content normal.        Cognition and Memory: Cognition and memory normal.        Judgment: Judgment normal.        07/30/2023    1:04 PM 05/01/2023    9:10 AM 08/08/2022   10:07 AM  Depression screen PHQ 2/9  Decreased Interest 0 0 0  Down, Depressed, Hopeless 0 0 0  PHQ - 2 Score 0 0 0  Altered sleeping 2 2 1   Tired, decreased energy 1 1 0  Change in appetite 0  0 0  Feeling bad or failure about yourself  0 0 0  Trouble concentrating 0 0 0  Moving slowly or fidgety/restless 0 0 0  Suicidal thoughts 0 0 0  PHQ-9 Score 3 3 1   Difficult doing work/chores Not difficult at all Not difficult at all Somewhat difficult      07/30/2023    1:04 PM 05/01/2023    9:11 AM 08/08/2022   10:07 AM 06/06/2021    9:36 AM  GAD 7 : Generalized Anxiety Score  Nervous, Anxious, on Edge 1 1 1 1   Control/stop worrying 1 1 2  0  Worry too much - different things 1 1 2  0  Trouble relaxing 0 0 2 0  Restless 0 0 2 0  Easily annoyed or irritable 0 0 0 0  Afraid - awful might happen 0 1 0 0  Total GAD 7 Score 3 4 9 1   Anxiety Difficulty Not difficult at all Not difficult at all Somewhat difficult Not difficult at all   Assessment/ Plan: Beryle Beams here for annual physical exam.  1. Routine general medical examination at a health care facility Discussed with patient to continue healthy lifestyle choices, including diet (rich in fruits, vegetables, and lean proteins, and low in salt and simple carbohydrates) and exercise (at least  30 minutes of moderate physical activity daily). Limit beverages high is sugar. Recommended at least 80-100 oz of water daily.  Reviewed results of labs on 05/01/23 with patient.   2. BMI 31.0-31.9,adult Referral placed as below.  - Amb ref to Medical Nutrition Therapy-MNT  3. Polyarthralgia Patient to follow up with Orthopedics. Follow up scheduled in December.  Reviewed labs on 05/01/23.  Patient declined referral to Rheumatology at this time given normal labs.   Patient to follow up in 1 year for annual exam or sooner if needed.  The above assessment and management plan was discussed with the patient. The patient verbalized understanding of and has agreed to the management plan. Patient is aware to call the clinic if symptoms persist or worsen. Patient is aware when to return to the clinic for a follow-up visit. Patient educated on  when it is appropriate to go to the emergency department.   Neale Burly, DNP-FNP [facility] [address] [place], [place] [address] [phone]

## 2023-09-10 DIAGNOSIS — Z6832 Body mass index (BMI) 32.0-32.9, adult: Secondary | ICD-10-CM | POA: Diagnosis not present

## 2023-09-10 DIAGNOSIS — Z1231 Encounter for screening mammogram for malignant neoplasm of breast: Secondary | ICD-10-CM | POA: Diagnosis not present

## 2023-09-10 DIAGNOSIS — Z01419 Encounter for gynecological examination (general) (routine) without abnormal findings: Secondary | ICD-10-CM | POA: Diagnosis not present

## 2023-09-10 DIAGNOSIS — Z124 Encounter for screening for malignant neoplasm of cervix: Secondary | ICD-10-CM | POA: Diagnosis not present

## 2023-09-11 ENCOUNTER — Other Ambulatory Visit: Payer: Self-pay | Admitting: Obstetrics and Gynecology

## 2023-09-11 DIAGNOSIS — Z803 Family history of malignant neoplasm of breast: Secondary | ICD-10-CM

## 2023-09-12 ENCOUNTER — Encounter: Payer: Self-pay | Admitting: Family Medicine

## 2023-09-22 ENCOUNTER — Encounter: Payer: Self-pay | Admitting: Family Medicine

## 2023-09-23 NOTE — Progress Notes (Signed)
Normal mammogram. Repeat in one year

## 2023-10-14 ENCOUNTER — Inpatient Hospital Stay
Admission: RE | Admit: 2023-10-14 | Discharge: 2023-10-14 | Discharge status: Home / Self Care | Payer: 59 | Source: Ambulatory Visit | Attending: Obstetrics and Gynecology | Admitting: Obstetrics and Gynecology

## 2023-10-14 DIAGNOSIS — Z1239 Encounter for other screening for malignant neoplasm of breast: Secondary | ICD-10-CM | POA: Diagnosis not present

## 2023-10-14 DIAGNOSIS — Z803 Family history of malignant neoplasm of breast: Secondary | ICD-10-CM

## 2023-10-14 MED ORDER — GADOPICLENOL 0.5 MMOL/ML IV SOLN
8.0000 mL | Freq: Once | INTRAVENOUS | Status: AC | PRN
  Administered 2023-10-14: 8 mL via INTRAVENOUS

## 2023-10-15 ENCOUNTER — Other Ambulatory Visit: Payer: Self-pay | Admitting: Family Medicine

## 2023-10-15 DIAGNOSIS — F411 Generalized anxiety disorder: Secondary | ICD-10-CM

## 2023-10-15 DIAGNOSIS — K219 Gastro-esophageal reflux disease without esophagitis: Secondary | ICD-10-CM

## 2023-10-16 ENCOUNTER — Ambulatory Visit: Payer: 59 | Admitting: Nutrition

## 2023-10-24 ENCOUNTER — Telehealth (INDEPENDENT_AMBULATORY_CARE_PROVIDER_SITE_OTHER): Payer: BC Managed Care – PPO | Admitting: Family Medicine

## 2023-10-24 ENCOUNTER — Ambulatory Visit: Payer: BC Managed Care – PPO

## 2023-10-24 ENCOUNTER — Encounter: Payer: Self-pay | Admitting: Family Medicine

## 2023-10-24 DIAGNOSIS — N3000 Acute cystitis without hematuria: Secondary | ICD-10-CM

## 2023-10-24 DIAGNOSIS — R399 Unspecified symptoms and signs involving the genitourinary system: Secondary | ICD-10-CM | POA: Diagnosis not present

## 2023-10-24 LAB — URINALYSIS, COMPLETE
Bilirubin, UA: NEGATIVE
Glucose, UA: NEGATIVE
Ketones, UA: NEGATIVE
Nitrite, UA: POSITIVE — AB
Protein,UA: NEGATIVE
Specific Gravity, UA: 1.01 (ref 1.005–1.030)
Urobilinogen, Ur: 1 mg/dL (ref 0.2–1.0)
pH, UA: 6 (ref 5.0–7.5)

## 2023-10-24 LAB — MICROSCOPIC EXAMINATION

## 2023-10-24 MED ORDER — CEPHALEXIN 500 MG PO CAPS
500.0000 mg | ORAL_CAPSULE | Freq: Four times a day (QID) | ORAL | 0 refills | Status: DC
Start: 2023-10-24 — End: 2024-03-17

## 2023-10-24 MED ORDER — FLUCONAZOLE 150 MG PO TABS
150.0000 mg | ORAL_TABLET | Freq: Once | ORAL | 0 refills | Status: AC
Start: 2023-10-24 — End: 2023-10-24

## 2023-10-24 NOTE — Progress Notes (Signed)
Virtual Visit via MyChart video note  I connected with Teresa Woodard on 10/24/23 at 1333 by video and verified that I am speaking with the correct person using two identifiers. Teresa Woodard is currently located at home and patient are currently with her during visit. The provider, Elige Radon Jayln Branscom, MD is located in their office at time of visit.  Call ended at 1339  I discussed the limitations, risks, security and privacy concerns of performing an evaluation and management service by video and the availability of in person appointments. I also discussed with the patient that there may be a patient responsible charge related to this service. The patient expressed understanding and agreed to proceed.   History and Present Illness: Patient is calling in for urinary frequency and and dysuria and is taking azo and is helping. She denies fevers or chills or hematuria.  She feels like azo helps with pain and then it is not fully going away.  She feels that it started 1-2 weeks ago.     Outpatient Encounter Medications as of 10/24/2023  Medication Sig   cephALEXin (KEFLEX) 500 MG capsule Take 1 capsule (500 mg total) by mouth 4 (four) times daily.   fluconazole (DIFLUCAN) 150 MG tablet Take 1 tablet (150 mg total) by mouth once for 1 dose.   albuterol (VENTOLIN HFA) 108 (90 Base) MCG/ACT inhaler Inhale 2 puffs into the lungs every 6 (six) hours as needed for wheezing or shortness of breath.   Ascorbic Acid (VITAMIN C WITH ROSE HIPS) 1000 MG tablet Take 1,000 mg by mouth daily.   cholecalciferol (VITAMIN D) 1000 units tablet Take 1,000 Units by mouth daily.   FLUoxetine (PROZAC) 20 MG capsule TAKE ONE CAPSULE DAILY   fluticasone-salmeterol (ADVAIR) 100-50 MCG/ACT AEPB Inhale 1 puff into the lungs daily.   gabapentin (NEURONTIN) 100 MG capsule Take 100 mg by mouth at bedtime.   hydrOXYzine (VISTARIL) 25 MG capsule TAKE ONE CAPSULE THREE TIMES DAILY AS NEEDED   Multiple Vitamin  (MULTIVITAMIN) tablet Take 1 tablet by mouth daily.   Olopatadine HCl 0.2 % SOLN Apply 1 drop to eye daily.   omeprazole (PRILOSEC) 40 MG capsule TAKE ONE CAPSULE DAILY   Zinc 50 MG CAPS Take 1 capsule by mouth daily.   No facility-administered encounter medications on file as of 10/24/2023.    Review of Systems  Constitutional:  Negative for chills and fever.  Eyes:  Negative for visual disturbance.  Respiratory:  Negative for chest tightness and shortness of breath.   Cardiovascular:  Negative for chest pain and leg swelling.  Gastrointestinal:  Positive for abdominal pain.  Genitourinary:  Positive for dysuria, flank pain, frequency and urgency. Negative for difficulty urinating, hematuria, vaginal bleeding, vaginal discharge and vaginal pain.  Musculoskeletal:  Negative for back pain and gait problem.  Skin:  Negative for rash.  Neurological:  Negative for light-headedness and headaches.  Psychiatric/Behavioral:  Negative for agitation and behavioral problems.   All other systems reviewed and are negative.   Observations/Objective: Patient sounds comfortable and in no acute distress  Assessment and Plan: Problem List Items Addressed This Visit   None Visit Diagnoses     Acute cystitis without hematuria    -  Primary   Relevant Medications   cephALEXin (KEFLEX) 500 MG capsule   fluconazole (DIFLUCAN) 150 MG tablet   Other Relevant Orders   Urine Culture   Urinalysis, Complete       Urinalysis shows nitrite positive and trace blood  and 1+ leukocytes and 6-10 WBCs and few bacteria.  Will treat with cephalexin, patient says she often gets yeast infections so she will want the Diflucan for afterwards. Follow up plan: Return if symptoms worsen or fail to improve.     I discussed the assessment and treatment plan with the patient. The patient was provided an opportunity to ask questions and all were answered. The patient agreed with the plan and demonstrated an  understanding of the instructions.   The patient was advised to call back or seek an in-person evaluation if the symptoms worsen or if the condition fails to improve as anticipated.  The above assessment and management plan was discussed with the patient. The patient verbalized understanding of and has agreed to the management plan. Patient is aware to call the clinic if symptoms persist or worsen. Patient is aware when to return to the clinic for a follow-up visit. Patient educated on when it is appropriate to go to the emergency department.    I provided 6 minutes of non-face-to-face time during this encounter.    Nils Pyle, MD

## 2023-10-27 LAB — URINE CULTURE

## 2023-11-04 DIAGNOSIS — N390 Urinary tract infection, site not specified: Secondary | ICD-10-CM | POA: Diagnosis not present

## 2023-11-04 DIAGNOSIS — R350 Frequency of micturition: Secondary | ICD-10-CM | POA: Diagnosis not present

## 2023-11-12 DIAGNOSIS — J310 Chronic rhinitis: Secondary | ICD-10-CM | POA: Diagnosis not present

## 2023-11-12 DIAGNOSIS — J4530 Mild persistent asthma, uncomplicated: Secondary | ICD-10-CM | POA: Diagnosis not present

## 2023-11-12 DIAGNOSIS — M722 Plantar fascial fibromatosis: Secondary | ICD-10-CM | POA: Diagnosis not present

## 2023-11-12 DIAGNOSIS — B351 Tinea unguium: Secondary | ICD-10-CM | POA: Diagnosis not present

## 2023-11-19 DIAGNOSIS — M545 Low back pain, unspecified: Secondary | ICD-10-CM | POA: Diagnosis not present

## 2023-11-19 DIAGNOSIS — R102 Pelvic and perineal pain: Secondary | ICD-10-CM | POA: Diagnosis not present

## 2023-12-10 DIAGNOSIS — B351 Tinea unguium: Secondary | ICD-10-CM | POA: Diagnosis not present

## 2024-01-30 ENCOUNTER — Ambulatory Visit: Payer: BC Managed Care – PPO | Admitting: Family Medicine

## 2024-03-16 ENCOUNTER — Encounter: Payer: Self-pay | Admitting: Family Medicine

## 2024-03-17 ENCOUNTER — Encounter: Payer: Self-pay | Admitting: Family Medicine

## 2024-03-17 ENCOUNTER — Ambulatory Visit (INDEPENDENT_AMBULATORY_CARE_PROVIDER_SITE_OTHER): Payer: BC Managed Care – PPO | Admitting: Family Medicine

## 2024-03-17 VITALS — BP 117/72 | HR 75 | Temp 98.0°F | Ht 63.0 in | Wt 191.0 lb

## 2024-03-17 DIAGNOSIS — M19079 Primary osteoarthritis, unspecified ankle and foot: Secondary | ICD-10-CM | POA: Insufficient documentation

## 2024-03-17 DIAGNOSIS — J4520 Mild intermittent asthma, uncomplicated: Secondary | ICD-10-CM | POA: Diagnosis not present

## 2024-03-17 DIAGNOSIS — E559 Vitamin D deficiency, unspecified: Secondary | ICD-10-CM

## 2024-03-17 DIAGNOSIS — E7800 Pure hypercholesterolemia, unspecified: Secondary | ICD-10-CM | POA: Diagnosis not present

## 2024-03-17 DIAGNOSIS — M722 Plantar fascial fibromatosis: Secondary | ICD-10-CM | POA: Insufficient documentation

## 2024-03-17 DIAGNOSIS — F411 Generalized anxiety disorder: Secondary | ICD-10-CM

## 2024-03-17 DIAGNOSIS — R739 Hyperglycemia, unspecified: Secondary | ICD-10-CM | POA: Diagnosis not present

## 2024-03-17 DIAGNOSIS — E66811 Obesity, class 1: Secondary | ICD-10-CM | POA: Diagnosis not present

## 2024-03-17 LAB — LIPID PANEL

## 2024-03-17 NOTE — Progress Notes (Signed)
 Subjective:  Patient ID: Teresa Woodard, female    DOB: [birth year], 53 y.o.   MRN: [id number]  Patient Care Team: Arrie Senate, FNP as PCP - General (Family Medicine) [facility], [facility]   Chief Complaint:  Medical Management of Chronic Issues (No acute concerns)   HPI: Teresa Woodard is a 53 y.o. female presenting on 03/17/2024 for Medical Management of Chronic Issues (No acute concerns)  HPI 1. Mild intermittent asthma without complication States that she has not used albuterol in several months. No nighttime awakenings, shortness of breath, cough. States that she is handling pollen season well.   2. GAD (generalized anxiety disorder) Stopped taking prozac.  She is taking hydroxyzine once daily. States that she is looking after mother in law which is sometimes stressful. Denies drowsiness. Denies SI.   3. Obesity (BMI 30.0-34.9)/4. Pure hypercholesterolemia States that she has tried work on diet and lifestyle. She is walking some now that it is warmer. 30 minutes daily. Reducing sugar, only drinking water. If any sodas, zero sugar. States that weight continues to increase   5. Vitamin D deficiency Taking supplement, denies numbness tingling fatigue, bone pain.     Relevant past medical, surgical, family, and social history reviewed and updated as indicated.  Allergies and medications reviewed and updated. Data reviewed: Chart in Epic.   Past Medical History:  Diagnosis Date   Allergy    Asthma     Past Surgical History:  Procedure Laterality Date   TUBAL LIGATION      Social History   Socioeconomic History   Marital status: Married    Spouse name: Not on file   Number of children: Not on file   Years of education: Not on file   Highest education level: Not on file  Occupational History   Not on file  Tobacco Use   Smoking status: Never   Smokeless tobacco: Never  Vaping Use   Vaping status: Never Used  Substance  and Sexual Activity   Alcohol use: No   Drug use: No   Sexual activity: Not on file  Other Topics Concern   Not on file  Social History Narrative   Not on file   Social Drivers of Health   Financial Resource Strain: Not on file  Food Insecurity: No Food Insecurity (03/17/2024)   Hunger Vital Sign    Worried About Running Out of Food in the Last Year: Never true    Ran Out of Food in the Last Year: Never true  Transportation Needs: No Transportation Needs (03/17/2024)   PRAPARE - Administrator, Civil Service (Medical): No    Lack of Transportation (Non-Medical): No  Physical Activity: Not on file  Stress: Not on file  Social Connections: Unknown (09/16/2022)   Received from [facility], [facility]   Social Network    Social Network: Not on file  Intimate Partner Violence: Not At Risk (03/17/2024)   Humiliation, Afraid, Rape, and Kick questionnaire    Fear of Current or Ex-Partner: No    Emotionally Abused: No    Physically Abused: No    Sexually Abused: No    Outpatient Encounter Medications as of 03/17/2024  Medication Sig   albuterol (VENTOLIN HFA) 108 (90 Base) MCG/ACT inhaler Inhale 2 puffs into the lungs every 6 (six) hours as needed for wheezing or shortness of breath.   Ascorbic Acid (VITAMIN C WITH ROSE HIPS) 1000 MG tablet Take 1,000 mg by mouth  daily.   cholecalciferol (VITAMIN D) 1000 units tablet Take 1,000 Units by mouth daily.   FLUoxetine (PROZAC) 20 MG capsule TAKE ONE CAPSULE DAILY   fluticasone-salmeterol (ADVAIR) 100-50 MCG/ACT AEPB Inhale 1 puff into the lungs daily.   gabapentin (NEURONTIN) 100 MG capsule Take 100 mg by mouth at bedtime.   hydrOXYzine (VISTARIL) 25 MG capsule TAKE ONE CAPSULE THREE TIMES DAILY AS NEEDED   Multiple Vitamin (MULTIVITAMIN) tablet Take 1 tablet by mouth daily.   Olopatadine HCl 0.2 % SOLN Apply 1 drop to eye daily.   omeprazole (PRILOSEC) 40 MG capsule TAKE ONE CAPSULE DAILY   Zinc 50 MG CAPS Take 1 capsule  by mouth daily.   [DISCONTINUED] cephALEXin (KEFLEX) 500 MG capsule Take 1 capsule (500 mg total) by mouth 4 (four) times daily.   No facility-administered encounter medications on file as of 03/17/2024.    No Known Allergies  Review of Systems As per HPI  Objective:  BP 117/72   Pulse 75   Temp 98 F (36.7 C)   Ht 5\' 3"  (1.6 m)   Wt 191 lb (86.6 kg)   LMP 01/02/2022   SpO2 95%   BMI 33.83 kg/m    Wt Readings from Last 3 Encounters:  03/17/24 191 lb (86.6 kg)  07/30/23 180 lb (81.6 kg)  05/01/23 178 lb (80.7 kg)    Physical Exam Constitutional:      General: She is awake. She is not in acute distress.    Appearance: Normal appearance. She is well-developed and well-groomed. She is obese. She is not ill-appearing, toxic-appearing or diaphoretic.  Cardiovascular:     Rate and Rhythm: Normal rate and regular rhythm.     Pulses: Normal pulses.          Radial pulses are 2+ on the right side and 2+ on the left side.       Posterior tibial pulses are 2+ on the right side and 2+ on the left side.     Heart sounds: Normal heart sounds. No murmur heard.    No gallop.  Pulmonary:     Effort: Pulmonary effort is normal. No respiratory distress.     Breath sounds: Normal breath sounds. No stridor. No wheezing, rhonchi or rales.  Musculoskeletal:     Cervical back: Full passive range of motion without pain and neck supple.     Right lower leg: No edema.     Left lower leg: No edema.  Skin:    General: Skin is warm.     Capillary Refill: Capillary refill takes less than 2 seconds.  Neurological:     General: No focal deficit present.     Mental Status: She is alert, oriented to person, place, and time and easily aroused. Mental status is at baseline.     GCS: GCS eye subscore is 4. GCS verbal subscore is 5. GCS motor subscore is 6.     Motor: No weakness.  Psychiatric:        Attention and Perception: Attention and perception normal.        Mood and Affect: Mood and affect  normal.        Speech: Speech normal.        Behavior: Behavior normal. Behavior is cooperative.        Thought Content: Thought content normal. Thought content does not include homicidal or suicidal ideation. Thought content does not include homicidal or suicidal plan.        Cognition and Memory: Cognition  and memory normal.        Judgment: Judgment normal.     Results for orders placed or performed in visit on 10/24/23  Microscopic Examination   Collection Time: 10/24/23 10:07 AM   Urine  Result Value Ref Range   WBC, UA 6-10 (A) 0 - 5 /hpf   RBC, Urine 0-2 0 - 2 /hpf   Epithelial Cells (non renal) 0-10 0 - 10 /hpf   Bacteria, UA Few None seen/Few  Urinalysis, Complete   Collection Time: 10/24/23 10:07 AM  Result Value Ref Range   Specific Gravity, UA 1.010 1.005 - 1.030   pH, UA 6.0 5.0 - 7.5   Color, UA Orange Yellow   Appearance Ur Hazy (A) Clear   Leukocytes,UA 1+ (A) Negative   Protein,UA Negative Negative/Trace   Glucose, UA Negative Negative   Ketones, UA Negative Negative   RBC, UA Trace (A) Negative   Bilirubin, UA Negative Negative   Urobilinogen, Ur 1.0 0.2 - 1.0 mg/dL   Nitrite, UA Positive (A) Negative   Microscopic Examination See below:   Urine Culture   Collection Time: 10/24/23  1:17 PM   Specimen: Urine   UR  Result Value Ref Range   Urine Culture, Routine Final report    Organism ID, Bacteria Comment        03/17/2024    8:14 AM 07/30/2023    1:04 PM 05/01/2023    9:10 AM 08/08/2022   10:07 AM 07/04/2021    9:47 AM  Depression screen PHQ 2/9  Decreased Interest 0 0 0 0 0  Down, Depressed, Hopeless 0 0 0 0 0  PHQ - 2 Score 0 0 0 0 0  Altered sleeping 2 2 2 1    Tired, decreased energy 0 1 1 0   Change in appetite 0 0 0 0   Feeling bad or failure about yourself  0 0 0 0   Trouble concentrating 0 0 0 0   Moving slowly or fidgety/restless 0 0 0 0   Suicidal thoughts 0 0 0 0   PHQ-9 Score 2 3 3 1    Difficult doing work/chores Not difficult at  all Not difficult at all Not difficult at all Somewhat difficult        03/17/2024    8:14 AM 07/30/2023    1:04 PM 05/01/2023    9:11 AM 08/08/2022   10:07 AM  GAD 7 : Generalized Anxiety Score  Nervous, Anxious, on Edge 1 1 1 1   Control/stop worrying 1 1 1 2   Worry too much - different things 1 1 1 2   Trouble relaxing 1 0 0 2  Restless 0 0 0 2  Easily annoyed or irritable 0 0 0 0  Afraid - awful might happen 0 0 1 0  Total GAD 7 Score 4 3 4 9   Anxiety Difficulty Not difficult at all Not difficult at all Not difficult at all Somewhat difficult   Pertinent labs & imaging results that were available during my care of the patient were reviewed by me and considered in my medical decision making.  Assessment & Plan:  Teresa Woodard was seen today for medical management of chronic issues.  Diagnoses and all orders for this visit:  1. Mild intermittent asthma without complication (Primary) Well controlled. Continue current regimen.   2. GAD (generalized anxiety disorder) Well controlled. Continue current regimen.   3. Obesity (BMI 30.0-34.9) Provided patient with resources for weight loss and healthy lifestyle modifications. Labs as below.  Will communicate results to patient once available. Will await results to determine next steps.  - TSH - CMP14+EGFR - CBC with Differential/Platelet  4. Pure hypercholesterolemia Labs as below. Will communicate results to patient once available. Will await results to determine next steps.  Fasting  - Lipid panel  5. Vitamin D deficiency Labs as below. Will communicate results to patient once available. Will await results to determine next steps.  - VITAMIN D 25 Hydroxy (Vit-D Deficiency, Fractures)  Continue all other maintenance medications.  Follow up plan: Return in about 6 months (around 09/16/2024) for CPE.   Continue healthy lifestyle choices, including diet (rich in fruits, vegetables, and lean proteins, and low in salt and simple  carbohydrates) and exercise (at least 30 minutes of moderate physical activity daily).  Written and verbal instructions provided   The above assessment and management plan was discussed with the patient. The patient verbalized understanding of and has agreed to the management plan. Patient is aware to call the clinic if they develop any new symptoms or if symptoms persist or worsen. Patient is aware when to return to the clinic for a follow-up visit. Patient educated on when it is appropriate to go to the emergency department.   Jacqualyn Mates, DNP-FNP [facility] [address] [place], [place] [address] [phone]

## 2024-03-17 NOTE — Patient Instructions (Signed)
 E2M - Eager to Motivate  P:E diet  Mediterranean Diet  ChatGPT  Move with Joni Reining  Yoga with Adriene  Noom  Weight Watchers

## 2024-03-18 ENCOUNTER — Encounter: Payer: Self-pay | Admitting: Family Medicine

## 2024-03-18 LAB — CMP14+EGFR
ALT: 45 IU/L — ABNORMAL HIGH (ref 0–32)
AST: 31 IU/L (ref 0–40)
Albumin: 4.1 g/dL (ref 3.8–4.9)
Alkaline Phosphatase: 114 IU/L (ref 44–121)
BUN/Creatinine Ratio: 14 (ref 9–23)
BUN: 13 mg/dL (ref 6–24)
Bilirubin Total: 0.3 mg/dL (ref 0.0–1.2)
CO2: 21 mmol/L (ref 20–29)
Calcium: 9.2 mg/dL (ref 8.7–10.2)
Chloride: 103 mmol/L (ref 96–106)
Creatinine, Ser: 0.96 mg/dL (ref 0.57–1.00)
Globulin, Total: 2.6 g/dL (ref 1.5–4.5)
Glucose: 101 mg/dL — ABNORMAL HIGH (ref 70–99)
Potassium: 3.8 mmol/L (ref 3.5–5.2)
Sodium: 140 mmol/L (ref 134–144)
Total Protein: 6.7 g/dL (ref 6.0–8.5)
eGFR: 70 mL/min/{1.73_m2} (ref >59)

## 2024-03-18 LAB — CBC WITH DIFFERENTIAL/PLATELET
Basophils Absolute: 0 10*3/uL (ref 0.0–0.2)
Basos: 1 %
EOS (ABSOLUTE): 0.3 10*3/uL (ref 0.0–0.4)
Eos: 5 %
Hematocrit: 40.8 % (ref 34.0–46.6)
Hemoglobin: 13.9 g/dL (ref 11.1–15.9)
Immature Grans (Abs): 0 10*3/uL (ref 0.0–0.1)
Immature Granulocytes: 0 %
Lymphocytes Absolute: 1.7 10*3/uL (ref 0.7–3.1)
Lymphs: 30 %
MCH: 29.8 pg (ref 26.6–33.0)
MCHC: 34.1 g/dL (ref 31.5–35.7)
MCV: 87 fL (ref 79–97)
Monocytes Absolute: 0.5 10*3/uL (ref 0.1–0.9)
Monocytes: 9 %
Neutrophils Absolute: 3.2 10*3/uL (ref 1.4–7.0)
Neutrophils: 55 %
Platelets: 234 10*3/uL (ref 150–450)
RBC: 4.67 x10E6/uL (ref 3.77–5.28)
RDW: 13.6 % (ref 11.7–15.4)
WBC: 5.8 10*3/uL (ref 3.4–10.8)

## 2024-03-18 LAB — VITAMIN D 25 HYDROXY (VIT D DEFICIENCY, FRACTURES): Vit D, 25-Hydroxy: 65.5 ng/mL (ref 30.0–100.0)

## 2024-03-18 LAB — LIPID PANEL
Cholesterol, Total: 199 mg/dL (ref 100–199)
HDL: 41 mg/dL (ref >39)
LDL CALC COMMENT:: 4.9 ratio — ABNORMAL HIGH (ref 0.0–4.4)
LDL Chol Calc (NIH): 130 mg/dL — ABNORMAL HIGH (ref 0–99)
Triglycerides: 158 mg/dL — ABNORMAL HIGH (ref 0–149)
VLDL Cholesterol Cal: 28 mg/dL (ref 5–40)

## 2024-03-18 LAB — TSH: TSH: 1.77 u[IU]/mL (ref 0.450–4.500)

## 2024-03-18 NOTE — Progress Notes (Signed)
 Can we add on A1C? Slightly elevated ALT, would like to repeat in 1-3 months. Avoid liver toxic medications such as tylenol. Cholesterol is elevated. Diet encouraged - increase intake of fresh fruits and vegetables, increase intake of lean proteins. Bake, broil, or grill foods. Avoid fried, greasy, and fatty foods. Avoid fast foods. Increase intake of fiber-rich whole grains. Exercise encouraged - at least 150 minutes per week and advance as tolerated. Can also try red yeast rice and we will recheck in 3 months. The 10-year ASCVD risk score (Arnett DK, et al., 2019) is: 2%

## 2024-03-24 ENCOUNTER — Other Ambulatory Visit: Payer: Self-pay

## 2024-03-24 ENCOUNTER — Encounter: Payer: Self-pay | Admitting: Family Medicine

## 2024-03-24 DIAGNOSIS — R7309 Other abnormal glucose: Secondary | ICD-10-CM

## 2024-03-25 ENCOUNTER — Other Ambulatory Visit

## 2024-03-25 ENCOUNTER — Encounter: Payer: Self-pay | Admitting: Family Medicine

## 2024-03-25 DIAGNOSIS — R7309 Other abnormal glucose: Secondary | ICD-10-CM

## 2024-03-25 LAB — SPECIMEN STATUS REPORT

## 2024-03-25 LAB — HGB A1C W/O EAG: Hgb A1c MFr Bld: 6.1 % — ABNORMAL HIGH (ref 4.8–5.6)

## 2024-03-25 LAB — BAYER DCA HB A1C WAIVED: HB A1C (BAYER DCA - WAIVED): 5.9 % — ABNORMAL HIGH (ref 4.8–5.6)

## 2024-03-25 NOTE — Progress Notes (Signed)
 I am not sure if A1C was added on a second time, but A1C is still in prediabetes range. Continue healthy lifestyle modifications.

## 2024-03-30 ENCOUNTER — Encounter: Payer: Self-pay | Admitting: *Deleted

## 2024-04-08 ENCOUNTER — Other Ambulatory Visit: Payer: Self-pay | Admitting: Family Medicine

## 2024-04-08 DIAGNOSIS — F411 Generalized anxiety disorder: Secondary | ICD-10-CM

## 2024-04-12 ENCOUNTER — Other Ambulatory Visit: Payer: Self-pay | Admitting: Family Medicine

## 2024-04-12 DIAGNOSIS — K219 Gastro-esophageal reflux disease without esophagitis: Secondary | ICD-10-CM

## 2024-05-17 DIAGNOSIS — B351 Tinea unguium: Secondary | ICD-10-CM | POA: Diagnosis not present

## 2024-07-30 ENCOUNTER — Ambulatory Visit: Admitting: Family

## 2024-07-30 ENCOUNTER — Encounter: Admitting: Family Medicine

## 2024-07-30 ENCOUNTER — Encounter: Payer: Self-pay | Admitting: Family

## 2024-07-30 VITALS — BP 119/81 | HR 74 | Temp 97.3°F | Ht 63.0 in | Wt 187.8 lb

## 2024-07-30 DIAGNOSIS — M19071 Primary osteoarthritis, right ankle and foot: Secondary | ICD-10-CM

## 2024-07-30 DIAGNOSIS — R7303 Prediabetes: Secondary | ICD-10-CM | POA: Diagnosis not present

## 2024-07-30 DIAGNOSIS — Z0000 Encounter for general adult medical examination without abnormal findings: Secondary | ICD-10-CM | POA: Diagnosis not present

## 2024-07-30 DIAGNOSIS — F411 Generalized anxiety disorder: Secondary | ICD-10-CM | POA: Diagnosis not present

## 2024-07-30 DIAGNOSIS — Z23 Encounter for immunization: Secondary | ICD-10-CM

## 2024-07-30 DIAGNOSIS — Z0001 Encounter for general adult medical examination with abnormal findings: Secondary | ICD-10-CM | POA: Diagnosis not present

## 2024-07-30 DIAGNOSIS — E66811 Obesity, class 1: Secondary | ICD-10-CM | POA: Diagnosis not present

## 2024-07-30 DIAGNOSIS — Z713 Dietary counseling and surveillance: Secondary | ICD-10-CM

## 2024-07-30 DIAGNOSIS — J4520 Mild intermittent asthma, uncomplicated: Secondary | ICD-10-CM

## 2024-07-30 DIAGNOSIS — E7800 Pure hypercholesterolemia, unspecified: Secondary | ICD-10-CM

## 2024-07-30 DIAGNOSIS — Z1159 Encounter for screening for other viral diseases: Secondary | ICD-10-CM | POA: Diagnosis not present

## 2024-07-30 DIAGNOSIS — E559 Vitamin D deficiency, unspecified: Secondary | ICD-10-CM

## 2024-07-30 DIAGNOSIS — K219 Gastro-esophageal reflux disease without esophagitis: Secondary | ICD-10-CM

## 2024-07-30 LAB — BAYER DCA HB A1C WAIVED: HB A1C (BAYER DCA - WAIVED): 5.4 % (ref 4.8–5.6)

## 2024-07-30 MED ORDER — ALBUTEROL SULFATE HFA 108 (90 BASE) MCG/ACT IN AERS: 2.0000 | INHALATION_SPRAY | Freq: Four times a day (QID) | RESPIRATORY_TRACT | 3 refills | Status: AC | PRN

## 2024-07-30 MED ORDER — HYDROXYZINE PAMOATE 25 MG PO CAPS: ORAL_CAPSULE | ORAL | 2 refills | Status: DC

## 2024-07-30 MED ORDER — PHENTERMINE HCL 37.5 MG PO TABS: 37.5000 mg | ORAL_TABLET | Freq: Every day | ORAL | 2 refills | Status: DC

## 2024-07-30 MED ORDER — FLUTICASONE-SALMETEROL 100-50 MCG/ACT IN AEPB: 1.0000 | INHALATION_SPRAY | Freq: Every day | RESPIRATORY_TRACT | 1 refills | Status: DC

## 2024-07-30 MED ORDER — OMEPRAZOLE 40 MG PO CPDR: 40.0000 mg | DELAYED_RELEASE_CAPSULE | Freq: Every day | ORAL | 1 refills | Status: AC

## 2024-07-30 NOTE — Addendum Note (Signed)
 Addended by: MICHELINE ROSINA FALCON on: 07/30/2024 08:47 AM   Modules accepted: Orders

## 2024-07-30 NOTE — Progress Notes (Signed)
 Subjective:    Patient ID: Teresa Woodard, female    DOB: 1971-07-02, 53 y.o.   MRN: [id number]  Chief Complaint  Patient presents with   Medical Management of Chronic Issues   Annual Exam   PT presents to the office today for CPE without pap.   She is followed by GYN annually.   She is followed by Podiatry  for hx of right foot fracture.   She reports she has gained 37 lbs over the last two years. Reports she is unable to lose weight and requesting medications.     07/30/2024    8:17 AM 03/17/2024    8:10 AM 07/30/2023    1:00 PM  Last 3 Weights  Weight (lbs) 187 lb 12.8 oz 191 lb 180 lb  Weight (kg) 85.186 kg 86.637 kg 81.647 kg     Asthma There is no cough, shortness of breath or wheezing. This is a chronic problem. The current episode started more than 1 year ago. The problem occurs intermittently. The problem has been resolved. Associated symptoms include heartburn. Her symptoms are aggravated by exposure to smoke and URI. Her symptoms are alleviated by rest and beta-agonist (advair). She reports moderate improvement on treatment. Her past medical history is significant for asthma.  Arthritis Presents for follow-up visit. She complains of pain and stiffness. Affected locations include the right ankle, right foot, right knee and left knee. Her pain is at a severity of 3/10.  Hyperlipidemia This is a chronic problem. The current episode started more than 1 year ago. The problem is uncontrolled. Recent lipid tests were reviewed and are high. Exacerbating diseases include obesity. Pertinent negatives include no shortness of breath. Current antihyperlipidemic treatment includes diet change and herbal therapy. Risk factors for coronary artery disease include dyslipidemia, obesity and post-menopausal.  Anxiety Presents for follow-up visit. Symptoms include excessive worry and nervous/anxious behavior. Patient reports no shortness of breath. Symptoms occur occasionally. The  severity of symptoms is mild.   Her past medical history is significant for asthma.  Diabetes She presents for her follow-up diabetic visit. Diabetes type: prediabetic. Hypoglycemia symptoms include nervousness/anxiousness. Pertinent negatives for diabetes include no blurred vision and no foot paresthesias. Risk factors for coronary artery disease include dyslipidemia. She is following a generally healthy diet. (Does not check glucose at home)  Gastroesophageal Reflux She complains of belching and heartburn. She reports no coughing or no wheezing. This is a chronic problem. The current episode started more than 1 year ago. The problem occurs occasionally. The symptoms are aggravated by certain foods. She has tried a PPI for the symptoms. The treatment provided moderate relief.      Review of Systems  Eyes:  Negative for blurred vision.  Respiratory:  Negative for cough, shortness of breath and wheezing.   Gastrointestinal:  Positive for heartburn.  Musculoskeletal:  Positive for arthritis and stiffness.  Psychiatric/Behavioral:  The patient is nervous/anxious.   All other systems reviewed and are negative.   Social History   Socioeconomic History   Marital status: Married    Spouse name: Not on file   Number of children: Not on file   Years of education: Not on file   Highest education level: Some college, no degree  Occupational History   Not on file  Tobacco Use   Smoking status: Never   Smokeless tobacco: Never  Vaping Use   Vaping status: Never Used  Substance and Sexual Activity   Alcohol use: No   Drug use: No  Sexual activity: Not on file  Other Topics Concern   Not on file  Social History Narrative   Not on file   Social Drivers of Health   Financial Resource Strain: Low Risk  (07/29/2024)   Overall Financial Resource Strain (CARDIA)    Difficulty of Paying Living Expenses: Not very hard  Food Insecurity: No Food Insecurity (07/29/2024)   Hunger Vital Sign     Worried About Running Out of Food in the Last Year: Never true    Ran Out of Food in the Last Year: Never true  Transportation Needs: No Transportation Needs (07/29/2024)   PRAPARE - Administrator, Civil Service (Medical): No    Lack of Transportation (Non-Medical): No  Physical Activity: Sufficiently Active (07/29/2024)   Exercise Vital Sign    Days of Exercise per Week: 5 days    Minutes of Exercise per Session: 30 min  Stress: Stress Concern Present (07/29/2024)   [employer] of Occupational Health - Occupational Stress Questionnaire    Feeling of Stress: To some extent  Social Connections: Moderately Integrated (07/29/2024)   Social Connection and Isolation Panel    Frequency of Communication with Friends and Family: More than three times a week    Frequency of Social Gatherings with Friends and Family: More than three times a week    Attends Religious Services: 1 to 4 times per year    Active Member of [employer] or Organizations: No    Attends Engineer, structural: Not on file    Marital Status: Married   Family History  Problem Relation Age of Onset   Arthritis/Rheumatoid Mother    Cancer Mother        breast, uterine,lung   Breast cancer Mother 79   Breast cancer Paternal Aunt    Colitis Neg Hx    Esophageal cancer Neg Hx    Stomach cancer Neg Hx    Rectal cancer Neg Hx         Objective:   Physical Exam Vitals reviewed.  Constitutional:      General: She is not in acute distress.    Appearance: She is well-developed. She is obese.  HENT:     Head: Normocephalic and atraumatic.     Right Ear: Tympanic membrane normal.     Left Ear: Tympanic membrane normal.  Eyes:     Pupils: Pupils are equal, round, and reactive to light.  Neck:     Thyroid : No thyromegaly.  Cardiovascular:     Rate and Rhythm: Normal rate and regular rhythm.     Heart sounds: Normal heart sounds. No murmur heard. Pulmonary:     Effort: Pulmonary effort is normal. No  respiratory distress.     Breath sounds: Normal breath sounds. No wheezing.  Abdominal:     General: Bowel sounds are normal. There is no distension.     Palpations: Abdomen is soft.     Tenderness: There is no abdominal tenderness.  Musculoskeletal:        General: No tenderness. Normal range of motion.     Cervical back: Normal range of motion and neck supple.  Skin:    General: Skin is warm and dry.  Neurological:     Mental Status: She is alert and oriented to person, place, and time.     Cranial Nerves: No cranial nerve deficit.     Deep Tendon Reflexes: Reflexes are normal and symmetric.  Psychiatric:        Behavior: Behavior  normal.        Thought Content: Thought content normal.        Judgment: Judgment normal.       BP 119/81   Pulse 74   Temp (!) 97.3 F (36.3 C) (Temporal)   Ht 5' 3 (1.6 m)   Wt 187 lb 12.8 oz (85.2 kg)   LMP 01/02/2022   SpO2 94%   BMI 33.27 kg/m      Assessment & Plan:  Charnika Herbst comes in today with chief complaint of Medical Management of Chronic Issues and Annual Exam   Diagnosis and orders addressed:  1. Annual physical exam (Primary) - Bayer DCA Hb A1c Waived - CMP14+EGFR - CBC with Differential/Platelet - Lipid panel - Hepatitis C antibody - VITAMIN D  25 Hydroxy (Vit-D Deficiency, Fractures)  2. Obesity (BMI 30.0-34.9) - CMP14+EGFR - CBC with Differential/Platelet - phentermine  (ADIPEX-P ) 37.5 MG tablet; Take 1 tablet (37.5 mg total) by mouth daily before breakfast.  Dispense: 30 tablet; Refill: 2  3. GAD (generalized anxiety disorder) - hydrOXYzine  (VISTARIL ) 25 MG capsule; TAKE ONE CAPSULE THREE TIMES DAILY AS NEEDED  Dispense: 90 capsule; Refill: 2 - CMP14+EGFR - CBC with Differential/Platelet  4. Primary localized osteoarthrosis of right ankle and foot - CMP14+EGFR - CBC with Differential/Platelet  5. Pure hypercholesterolemia - CMP14+EGFR - CBC with Differential/Platelet - Lipid panel  6.  Vitamin D  deficiency - CMP14+EGFR - CBC with Differential/Platelet - VITAMIN D  25 Hydroxy (Vit-D Deficiency, Fractures)  7. Mild intermittent asthma without complication - fluticasone -salmeterol (ADVAIR) 100-50 MCG/ACT AEPB; Inhale 1 puff into the lungs daily.  Dispense: 90 each; Refill: 1 - albuterol  (VENTOLIN  HFA) 108 (90 Base) MCG/ACT inhaler; Inhale 2 puffs into the lungs every 6 (six) hours as needed for wheezing or shortness of breath.  Dispense: 18 g; Refill: 3 - CMP14+EGFR - CBC with Differential/Platelet  8. Gastroesophageal reflux disease without esophagitis - CMP14+EGFR - CBC with Differential/Platelet  9. GERD without esophagitis - omeprazole  (PRILOSEC) 40 MG capsule; Take 1 capsule (40 mg total) by mouth daily.  Dispense: 90 capsule; Refill: 1 - CMP14+EGFR - CBC with Differential/Platelet  10. Prediabetes - Bayer DCA Hb A1c Waived - CMP14+EGFR - CBC with Differential/Platelet  11. Need for hepatitis C screening test - CMP14+EGFR - CBC with Differential/Platelet - Hepatitis C antibody  12. Weight loss counseling, encounter for Will start phentermine   Avoid caffeine Encourage healthy diet and exercise   - phentermine  (ADIPEX-P ) 37.5 MG tablet; Take 1 tablet (37.5 mg total) by mouth daily before breakfast.  Dispense: 30 tablet; Refill: 2   Labs pending Continue current medications  Keep follow up with specialists  Health Maintenance reviewed Diet and exercise encouraged  Return in about 3 months (around 10/30/2024), or if symptoms worsen or fail to improve.    Bari Learn, FNP

## 2024-07-30 NOTE — Patient Instructions (Signed)

## 2024-07-31 LAB — CMP14+EGFR
ALT: 46 IU/L — ABNORMAL HIGH (ref 0–32)
AST: 29 IU/L (ref 0–40)
Albumin: 4.2 g/dL (ref 3.8–4.9)
Alkaline Phosphatase: 111 IU/L (ref 44–121)
BUN/Creatinine Ratio: 12 (ref 9–23)
BUN: 12 mg/dL (ref 6–24)
Bilirubin Total: 0.4 mg/dL (ref 0.0–1.2)
CO2: 22 mmol/L (ref 20–29)
Calcium: 9.4 mg/dL (ref 8.7–10.2)
Chloride: 104 mmol/L (ref 96–106)
Creatinine, Ser: 1.02 mg/dL — ABNORMAL HIGH (ref 0.57–1.00)
Globulin, Total: 2.3 g/dL (ref 1.5–4.5)
Glucose: 96 mg/dL (ref 70–99)
Potassium: 4 mmol/L (ref 3.5–5.2)
Sodium: 141 mmol/L (ref 134–144)
Total Protein: 6.5 g/dL (ref 6.0–8.5)
eGFR: 65 mL/min/1.73 (ref >59)

## 2024-07-31 LAB — CBC WITH DIFFERENTIAL/PLATELET
Basophils Absolute: 0 x10E3/uL (ref 0.0–0.2)
Basos: 1 %
EOS (ABSOLUTE): 0.4 x10E3/uL (ref 0.0–0.4)
Eos: 6 %
Hematocrit: 41.2 % (ref 34.0–46.6)
Hemoglobin: 13.8 g/dL (ref 11.1–15.9)
Immature Grans (Abs): 0 x10E3/uL (ref 0.0–0.1)
Immature Granulocytes: 0 %
Lymphocytes Absolute: 1.8 x10E3/uL (ref 0.7–3.1)
Lymphs: 29 %
MCH: 30 pg (ref 26.6–33.0)
MCHC: 33.5 g/dL (ref 31.5–35.7)
MCV: 90 fL (ref 79–97)
Monocytes Absolute: 0.6 x10E3/uL (ref 0.1–0.9)
Monocytes: 10 %
Neutrophils Absolute: 3.3 x10E3/uL (ref 1.4–7.0)
Neutrophils: 54 %
Platelets: 242 x10E3/uL (ref 150–450)
RBC: 4.6 x10E6/uL (ref 3.77–5.28)
RDW: 14.1 % (ref 11.7–15.4)
WBC: 6.2 x10E3/uL (ref 3.4–10.8)

## 2024-07-31 LAB — HEPATITIS C ANTIBODY: Hep C Virus Ab: NONREACTIVE

## 2024-07-31 LAB — LIPID PANEL
Chol/HDL Ratio: 4.2 ratio (ref 0.0–4.4)
Cholesterol, Total: 187 mg/dL (ref 100–199)
HDL: 45 mg/dL (ref >39)
LDL Chol Calc (NIH): 117 mg/dL — ABNORMAL HIGH (ref 0–99)
Triglycerides: 143 mg/dL (ref 0–149)
VLDL Cholesterol Cal: 25 mg/dL (ref 5–40)

## 2024-07-31 LAB — VITAMIN D 25 HYDROXY (VIT D DEFICIENCY, FRACTURES): Vit D, 25-Hydroxy: 47.8 ng/mL (ref 30.0–100.0)

## 2024-08-03 ENCOUNTER — Ambulatory Visit: Payer: Self-pay | Admitting: Family

## 2024-08-03 NOTE — Telephone Encounter (Signed)
 Reviewed results with patient and patient voiced understanding.

## 2024-08-25 DIAGNOSIS — B351 Tinea unguium: Secondary | ICD-10-CM | POA: Diagnosis not present

## 2024-08-25 DIAGNOSIS — L603 Nail dystrophy: Secondary | ICD-10-CM | POA: Diagnosis not present

## 2024-09-21 DIAGNOSIS — Z6830 Body mass index (BMI) 30.0-30.9, adult: Secondary | ICD-10-CM | POA: Diagnosis not present

## 2024-09-21 DIAGNOSIS — Z01419 Encounter for gynecological examination (general) (routine) without abnormal findings: Secondary | ICD-10-CM | POA: Diagnosis not present

## 2024-09-22 ENCOUNTER — Other Ambulatory Visit: Payer: Self-pay | Admitting: Obstetrics and Gynecology

## 2024-09-22 DIAGNOSIS — Z9189 Other specified personal risk factors, not elsewhere classified: Secondary | ICD-10-CM

## 2024-10-25 ENCOUNTER — Other Ambulatory Visit: Payer: Self-pay | Admitting: Family

## 2024-10-25 ENCOUNTER — Ambulatory Visit
Admission: RE | Admit: 2024-10-25 | Discharge: 2024-10-25 | Disposition: A | Discharge status: Home / Self Care | Source: Ambulatory Visit | Attending: Obstetrics and Gynecology | Admitting: Obstetrics and Gynecology

## 2024-10-25 DIAGNOSIS — Z9189 Other specified personal risk factors, not elsewhere classified: Secondary | ICD-10-CM

## 2024-10-25 DIAGNOSIS — J4520 Mild intermittent asthma, uncomplicated: Secondary | ICD-10-CM

## 2024-10-25 DIAGNOSIS — Z803 Family history of malignant neoplasm of breast: Secondary | ICD-10-CM | POA: Diagnosis not present

## 2024-10-25 DIAGNOSIS — E66811 Obesity, class 1: Secondary | ICD-10-CM

## 2024-10-25 DIAGNOSIS — Z713 Dietary counseling and surveillance: Secondary | ICD-10-CM

## 2024-10-25 MED ORDER — GADOPICLENOL 0.5 MMOL/ML IV SOLN
9.0000 mL | Freq: Once | INTRAVENOUS | Status: AC | PRN
  Administered 2024-10-25: 9 mL via INTRAVENOUS

## 2024-11-01 ENCOUNTER — Ambulatory Visit: Payer: Self-pay | Admitting: Family

## 2024-11-01 ENCOUNTER — Encounter: Payer: Self-pay | Admitting: Family

## 2024-11-01 VITALS — BP 123/77 | HR 85 | Temp 97.2°F | Ht 63.0 in | Wt 171.8 lb

## 2024-11-01 DIAGNOSIS — Z6830 Body mass index (BMI) 30.0-30.9, adult: Secondary | ICD-10-CM

## 2024-11-01 DIAGNOSIS — E66811 Obesity, class 1: Secondary | ICD-10-CM | POA: Diagnosis not present

## 2024-11-01 DIAGNOSIS — Z23 Encounter for immunization: Secondary | ICD-10-CM | POA: Diagnosis not present

## 2024-11-01 DIAGNOSIS — Z713 Dietary counseling and surveillance: Secondary | ICD-10-CM | POA: Diagnosis not present

## 2024-11-01 DIAGNOSIS — E7800 Pure hypercholesterolemia, unspecified: Secondary | ICD-10-CM

## 2024-11-01 DIAGNOSIS — J4520 Mild intermittent asthma, uncomplicated: Secondary | ICD-10-CM

## 2024-11-01 DIAGNOSIS — F411 Generalized anxiety disorder: Secondary | ICD-10-CM

## 2024-11-01 DIAGNOSIS — E559 Vitamin D deficiency, unspecified: Secondary | ICD-10-CM

## 2024-11-01 LAB — CMP14+EGFR
ALT: 27 IU/L (ref 0–32)
AST: 25 IU/L (ref 0–40)
Albumin: 4.2 g/dL (ref 3.8–4.9)
Alkaline Phosphatase: 113 IU/L (ref 49–135)
BUN/Creatinine Ratio: 12 (ref 9–23)
BUN: 13 mg/dL (ref 6–24)
Bilirubin Total: 0.4 mg/dL (ref 0.0–1.2)
CO2: 24 mmol/L (ref 20–29)
Calcium: 9.7 mg/dL (ref 8.7–10.2)
Chloride: 104 mmol/L (ref 96–106)
Creatinine, Ser: 1.05 mg/dL — ABNORMAL HIGH (ref 0.57–1.00)
Globulin, Total: 2.3 g/dL (ref 1.5–4.5)
Glucose: 91 mg/dL (ref 70–99)
Potassium: 4.5 mmol/L (ref 3.5–5.2)
Sodium: 141 mmol/L (ref 134–144)
Total Protein: 6.5 g/dL (ref 6.0–8.5)
eGFR: 63 mL/min/1.73 (ref >59)

## 2024-11-01 MED ORDER — PHENTERMINE HCL 37.5 MG PO TABS: 37.5000 mg | ORAL_TABLET | Freq: Every day | ORAL | 2 refills | Status: AC

## 2024-11-01 NOTE — Progress Notes (Signed)
 Subjective:    Patient ID: Teresa Woodard, female    DOB: Jan 22, 1971, 53 y.o.   MRN: [id number]  Chief Complaint  Patient presents with   Medical Management of Chronic Issues   PT presents to the office today for chronic follow up.    She is followed by GYN annually.   She is followed by Podiatry  for hx of right foot fracture.   She reports she has gained 37 lbs over the last two years. She was started on phentermine  and has lost 16 lb. Her starting weight was 187 lb.     11/01/2024    9:19 AM 07/30/2024    8:17 AM 03/17/2024    8:10 AM  Last 3 Weights  Weight (lbs) 171 lb 12.8 oz 187 lb 12.8 oz 191 lb  Weight (kg) 77.928 kg 85.186 kg 86.637 kg     Asthma There is no cough, shortness of breath or wheezing. This is a chronic problem. The current episode started more than 1 year ago. The problem occurs intermittently. The problem has been resolved. Associated symptoms include heartburn. Her symptoms are aggravated by exposure to smoke and URI. Her symptoms are alleviated by rest and beta-agonist (advair). She reports moderate improvement on treatment. Her past medical history is significant for asthma.  Arthritis Presents for follow-up visit. She complains of pain and stiffness. Affected locations include the right ankle, right foot, right knee and left knee. Her pain is at a severity of 2/10.  Hyperlipidemia This is a chronic problem. The current episode started more than 1 year ago. The problem is uncontrolled. Recent lipid tests were reviewed and are high. Exacerbating diseases include obesity. Pertinent negatives include no shortness of breath. Current antihyperlipidemic treatment includes diet change and herbal therapy. Risk factors for coronary artery disease include dyslipidemia, obesity and post-menopausal.  Anxiety Presents for follow-up visit. Symptoms include excessive worry and nervous/anxious behavior. Patient reports no shortness of breath. Symptoms occur occasionally. The  severity of symptoms is mild.   Her past medical history is significant for asthma.  Diabetes She presents for her follow-up diabetic visit. Diabetes type: prediabetic. Hypoglycemia symptoms include nervousness/anxiousness. Pertinent negatives for diabetes include no blurred vision and no foot paresthesias. Risk factors for coronary artery disease include dyslipidemia. She is following a generally healthy diet. (Does not check glucose at home)  Gastroesophageal Reflux She complains of belching and heartburn. She reports no coughing or no wheezing. This is a chronic problem. The current episode started more than 1 year ago. The problem occurs occasionally. The symptoms are aggravated by certain foods. She has tried a PPI for the symptoms. The treatment provided moderate relief.      Review of Systems  Eyes:  Negative for blurred vision.  Respiratory:  Negative for cough, shortness of breath and wheezing.   Gastrointestinal:  Positive for heartburn.  Musculoskeletal:  Positive for stiffness.  Psychiatric/Behavioral:  The patient is nervous/anxious.   All other systems reviewed and are negative.   Social History   Socioeconomic History   Marital status: Married    Spouse name: Not on file   Number of children: Not on file   Years of education: Not on file   Highest education level: Some college, no degree  Occupational History   Not on file  Tobacco Use   Smoking status: Never   Smokeless tobacco: Never  Vaping Use   Vaping status: Never Used  Substance and Sexual Activity   Alcohol use: No   Drug  use: No   Sexual activity: Not on file  Other Topics Concern   Not on file  Social History Narrative   Not on file   Social Drivers of Health   Financial Resource Strain: Low Risk  (10/31/2024)   Overall Financial Resource Strain (CARDIA)    Difficulty of Paying Living Expenses: Not hard at all  Food Insecurity: No Food Insecurity (10/31/2024)   Hunger Vital Sign    Worried  About Running Out of Food in the Last Year: Never true    Ran Out of Food in the Last Year: Never true  Transportation Needs: No Transportation Needs (10/31/2024)   PRAPARE - Administrator, Civil Service (Medical): No    Lack of Transportation (Non-Medical): No  Physical Activity: Sufficiently Active (10/31/2024)   Exercise Vital Sign    Days of Exercise per Week: 5 days    Minutes of Exercise per Session: 90 min  Stress: Stress Concern Present (10/31/2024)   [employer] of Occupational Health - Occupational Stress Questionnaire    Feeling of Stress: To some extent  Social Connections: Moderately Integrated (10/31/2024)   Social Connection and Isolation Panel    Frequency of Communication with Friends and Family: More than three times a week    Frequency of Social Gatherings with Friends and Family: Three times a week    Attends Religious Services: 1 to 4 times per year    Active Member of Clubs or Organizations: No    Attends Engineer, Structural: Not on file    Marital Status: Married   Family History  Problem Relation Age of Onset   Arthritis/Rheumatoid Mother    Cancer Mother        breast, uterine,lung   Breast cancer Mother 88   Breast cancer Paternal Aunt    Colitis Neg Hx    Esophageal cancer Neg Hx    Stomach cancer Neg Hx    Rectal cancer Neg Hx         Objective:   Physical Exam Vitals reviewed.  Constitutional:      General: She is not in acute distress.    Appearance: She is well-developed. She is obese.  HENT:     Head: Normocephalic and atraumatic.     Right Ear: Tympanic membrane normal.     Left Ear: Tympanic membrane normal.  Eyes:     Pupils: Pupils are equal, round, and reactive to light.  Neck:     Thyroid : No thyromegaly.  Cardiovascular:     Rate and Rhythm: Normal rate and regular rhythm.     Heart sounds: Normal heart sounds. No murmur heard. Pulmonary:     Effort: Pulmonary effort is normal. No respiratory  distress.     Breath sounds: Normal breath sounds. No wheezing.  Abdominal:     General: Bowel sounds are normal. There is no distension.     Palpations: Abdomen is soft.     Tenderness: There is no abdominal tenderness.  Musculoskeletal:        General: No tenderness. Normal range of motion.     Cervical back: Normal range of motion and neck supple.  Skin:    General: Skin is warm and dry.  Neurological:     Mental Status: She is alert and oriented to person, place, and time.     Cranial Nerves: No cranial nerve deficit.     Deep Tendon Reflexes: Reflexes are normal and symmetric.  Psychiatric:  Behavior: Behavior normal.        Thought Content: Thought content normal.        Judgment: Judgment normal.       BP 123/77   Pulse 85   Temp (!) 97.2 F (36.2 C) (Temporal)   Ht 5' 3 (1.6 m)   Wt 171 lb 12.8 oz (77.9 kg)   LMP 01/02/2022   SpO2 100%   BMI 30.43 kg/m      Assessment & Plan:  Teresa Woodard comes in today with chief complaint of Medical Management of Chronic Issues   Diagnosis and orders addressed:  1. Obesity (BMI 30.0-34.9) - phentermine  (ADIPEX-P ) 37.5 MG tablet; Take 1 tablet (37.5 mg total) by mouth daily before breakfast.  Dispense: 30 tablet; Refill: 2 - CMP14+EGFR  2. Weight loss counseling, encounter for (Primary) - phentermine  (ADIPEX-P ) 37.5 MG tablet; Take 1 tablet (37.5 mg total) by mouth daily before breakfast.  Dispense: 30 tablet; Refill: 2 - CMP14+EGFR  3. Encounter for immunization - Flu vaccine trivalent PF, 6mos and older(Flulaval,Afluria,Fluarix,Fluzone) - CMP14+EGFR  4. Mild intermittent asthma without complication - CMP14+EGFR  5. GAD (generalized anxiety disorder) - CMP14+EGFR  6. Pure hypercholesterolemia - CMP14+EGFR  7. Vitamin D  deficiency - CMP14+EGFR   Labs pending Continue current medications, will refill phentermine  today. Keep up the great work! Health Maintenance reviewed Diet and exercise  encouraged  Return in about 3 months (around 01/30/2025), or if symptoms worsen or fail to improve.    Bari Learn, FNP

## 2024-11-01 NOTE — Addendum Note (Signed)
 Addended by: LAVELL LYE A on: 11/01/2024 10:40 AM   Modules accepted: Level of Service

## 2024-11-01 NOTE — Patient Instructions (Signed)

## 2024-11-02 ENCOUNTER — Ambulatory Visit: Payer: Self-pay | Admitting: Family

## 2024-11-23 ENCOUNTER — Other Ambulatory Visit: Payer: Self-pay | Admitting: Family

## 2024-11-23 DIAGNOSIS — J4520 Mild intermittent asthma, uncomplicated: Secondary | ICD-10-CM

## 2024-12-28 ENCOUNTER — Other Ambulatory Visit: Payer: Self-pay | Admitting: Family

## 2024-12-28 DIAGNOSIS — F411 Generalized anxiety disorder: Secondary | ICD-10-CM

## 2025-01-31 ENCOUNTER — Ambulatory Visit: Admitting: Family
